# Patient Record
Sex: Female | Born: 1950 | Race: White | Hispanic: No | Marital: Married | Smoking: Never smoker
Health system: Southern US, Community
[De-identification: ages and names within clinical notes are randomized; demographics above are authoritative.]

## PROBLEM LIST (undated history)

## (undated) DIAGNOSIS — F32A Depression, unspecified: Secondary | ICD-10-CM

## (undated) DIAGNOSIS — I77819 Aortic ectasia, unspecified site: Secondary | ICD-10-CM

## (undated) DIAGNOSIS — Z170 Estrogen receptor positive status [ER+]: Secondary | ICD-10-CM

## (undated) DIAGNOSIS — R195 Other fecal abnormalities: Secondary | ICD-10-CM

## (undated) DIAGNOSIS — D0510 Intraductal carcinoma in situ of unspecified breast: Secondary | ICD-10-CM

## (undated) DIAGNOSIS — D649 Anemia, unspecified: Secondary | ICD-10-CM

## (undated) DIAGNOSIS — D0511 Intraductal carcinoma in situ of right breast: Secondary | ICD-10-CM

## (undated) DIAGNOSIS — Z90722 Acquired absence of ovaries, bilateral: Secondary | ICD-10-CM

## (undated) DIAGNOSIS — Z9079 Acquired absence of other genital organ(s): Secondary | ICD-10-CM

## (undated) DIAGNOSIS — F329 Major depressive disorder, single episode, unspecified: Secondary | ICD-10-CM

## (undated) DIAGNOSIS — R079 Chest pain, unspecified: Secondary | ICD-10-CM

## (undated) DIAGNOSIS — J45909 Unspecified asthma, uncomplicated: Secondary | ICD-10-CM

## (undated) DIAGNOSIS — I10 Essential (primary) hypertension: Secondary | ICD-10-CM

## (undated) DIAGNOSIS — F410 Panic disorder [episodic paroxysmal anxiety] without agoraphobia: Secondary | ICD-10-CM

## (undated) DIAGNOSIS — C50919 Malignant neoplasm of unspecified site of unspecified female breast: Secondary | ICD-10-CM

## (undated) DIAGNOSIS — K449 Diaphragmatic hernia without obstruction or gangrene: Secondary | ICD-10-CM

## (undated) DIAGNOSIS — I5189 Other ill-defined heart diseases: Secondary | ICD-10-CM

## (undated) DIAGNOSIS — F4321 Adjustment disorder with depressed mood: Secondary | ICD-10-CM

## (undated) DIAGNOSIS — Z90710 Acquired absence of both cervix and uterus: Secondary | ICD-10-CM

## (undated) DIAGNOSIS — C50511 Malignant neoplasm of lower-outer quadrant of right female breast: Secondary | ICD-10-CM

## (undated) DIAGNOSIS — E785 Hyperlipidemia, unspecified: Secondary | ICD-10-CM

## (undated) DIAGNOSIS — E669 Obesity, unspecified: Secondary | ICD-10-CM

## (undated) DIAGNOSIS — Z79899 Other long term (current) drug therapy: Secondary | ICD-10-CM

## (undated) DIAGNOSIS — D259 Leiomyoma of uterus, unspecified: Secondary | ICD-10-CM

## (undated) DIAGNOSIS — Z973 Presence of spectacles and contact lenses: Secondary | ICD-10-CM

## (undated) HISTORY — DX: Acquired absence of other genital organ(s): Z90.79

## (undated) HISTORY — DX: Other fecal abnormalities: R19.5

## (undated) HISTORY — DX: Malignant neoplasm of lower-outer quadrant of right female breast: C50.511

## (undated) HISTORY — DX: Acquired absence of ovaries, bilateral: Z90.722

## (undated) HISTORY — DX: Obesity, unspecified: E66.9

## (undated) HISTORY — PX: TONSILLECTOMY: SUR1361

## (undated) HISTORY — DX: Estrogen receptor positive status (ER+): Z17.0

## (undated) HISTORY — PX: BREAST BIOPSY: SHX20

## (undated) HISTORY — PX: WISDOM TOOTH EXTRACTION: SHX21

## (undated) HISTORY — DX: Intraductal carcinoma in situ of unspecified breast: D05.10

## (undated) HISTORY — DX: Acquired absence of both cervix and uterus: Z90.710

## (undated) HISTORY — DX: Chest pain, unspecified: R07.9

## (undated) HISTORY — DX: Intraductal carcinoma in situ of right breast: D05.11

## (undated) HISTORY — PX: BREAST LUMPECTOMY: SHX2

## (undated) HISTORY — DX: Other ill-defined heart diseases: I51.89

## (undated) HISTORY — PX: HERNIA REPAIR: SHX51

## (undated) HISTORY — DX: Anemia, unspecified: D64.9

## (undated) HISTORY — DX: Aortic ectasia, unspecified site: I77.819

## (undated) HISTORY — DX: Adjustment disorder with depressed mood: F43.21

## (undated) HISTORY — DX: Other long term (current) drug therapy: Z79.899

## (undated) HISTORY — DX: Unspecified asthma, uncomplicated: J45.909

## (undated) HISTORY — PX: DILATION AND CURETTAGE OF UTERUS: SHX78

---

## 1996-05-28 HISTORY — PX: ABDOMINAL HYSTERECTOMY: SHX81

## 1997-11-03 ENCOUNTER — Other Ambulatory Visit: Admission: RE | Admit: 1997-11-03 | Discharge: 1997-11-03 | Payer: Self-pay | Admitting: Obstetrics & Gynecology

## 1997-12-09 ENCOUNTER — Ambulatory Visit (HOSPITAL_COMMUNITY): Admission: RE | Admit: 1997-12-09 | Discharge: 1997-12-09 | Payer: Self-pay | Admitting: Obstetrics & Gynecology

## 1998-12-15 ENCOUNTER — Ambulatory Visit (HOSPITAL_COMMUNITY): Admission: RE | Admit: 1998-12-15 | Discharge: 1998-12-15 | Payer: Self-pay | Admitting: Obstetrics & Gynecology

## 1998-12-15 ENCOUNTER — Encounter: Payer: Self-pay | Admitting: Obstetrics & Gynecology

## 1999-02-01 ENCOUNTER — Other Ambulatory Visit: Admission: RE | Admit: 1999-02-01 | Discharge: 1999-02-01 | Payer: Self-pay | Admitting: Obstetrics & Gynecology

## 2000-03-22 ENCOUNTER — Other Ambulatory Visit: Admission: RE | Admit: 2000-03-22 | Discharge: 2000-03-22 | Payer: Self-pay | Admitting: Obstetrics & Gynecology

## 2001-08-27 ENCOUNTER — Other Ambulatory Visit: Admission: RE | Admit: 2001-08-27 | Discharge: 2001-08-27 | Payer: Self-pay | Admitting: Obstetrics & Gynecology

## 2002-12-08 ENCOUNTER — Other Ambulatory Visit: Admission: RE | Admit: 2002-12-08 | Discharge: 2002-12-08 | Payer: Self-pay | Admitting: Obstetrics & Gynecology

## 2004-07-14 ENCOUNTER — Other Ambulatory Visit: Admission: RE | Admit: 2004-07-14 | Discharge: 2004-07-14 | Payer: Self-pay | Admitting: Obstetrics & Gynecology

## 2007-02-21 ENCOUNTER — Encounter: Admission: RE | Admit: 2007-02-21 | Discharge: 2007-02-21 | Payer: Self-pay | Admitting: Obstetrics & Gynecology

## 2010-06-19 ENCOUNTER — Encounter: Payer: Self-pay | Admitting: Obstetrics & Gynecology

## 2013-09-18 ENCOUNTER — Other Ambulatory Visit: Payer: Self-pay | Admitting: Obstetrics & Gynecology

## 2013-09-18 DIAGNOSIS — R928 Other abnormal and inconclusive findings on diagnostic imaging of breast: Secondary | ICD-10-CM

## 2013-10-01 ENCOUNTER — Ambulatory Visit
Admission: RE | Admit: 2013-10-01 | Discharge: 2013-10-01 | Disposition: A | Discharge status: Home / Self Care | Payer: BC Managed Care – PPO | Source: Ambulatory Visit | Attending: Obstetrics & Gynecology | Admitting: Obstetrics & Gynecology

## 2013-10-01 ENCOUNTER — Other Ambulatory Visit: Payer: Self-pay | Admitting: Obstetrics & Gynecology

## 2013-10-01 DIAGNOSIS — R928 Other abnormal and inconclusive findings on diagnostic imaging of breast: Secondary | ICD-10-CM

## 2013-10-06 ENCOUNTER — Other Ambulatory Visit: Payer: Self-pay | Admitting: Obstetrics & Gynecology

## 2013-10-06 ENCOUNTER — Ambulatory Visit
Admission: RE | Admit: 2013-10-06 | Discharge: 2013-10-06 | Disposition: A | Discharge status: Home / Self Care | Payer: BC Managed Care – PPO | Source: Ambulatory Visit | Attending: Obstetrics & Gynecology | Admitting: Obstetrics & Gynecology

## 2013-10-06 DIAGNOSIS — R921 Mammographic calcification found on diagnostic imaging of breast: Secondary | ICD-10-CM

## 2013-10-06 DIAGNOSIS — R928 Other abnormal and inconclusive findings on diagnostic imaging of breast: Secondary | ICD-10-CM

## 2013-10-07 ENCOUNTER — Other Ambulatory Visit: Payer: Self-pay | Admitting: Obstetrics & Gynecology

## 2013-10-07 DIAGNOSIS — C50919 Malignant neoplasm of unspecified site of unspecified female breast: Secondary | ICD-10-CM

## 2013-10-09 ENCOUNTER — Telehealth: Payer: Self-pay | Admitting: *Deleted

## 2013-10-09 ENCOUNTER — Encounter: Payer: Self-pay | Admitting: *Deleted

## 2013-10-09 DIAGNOSIS — C50511 Malignant neoplasm of lower-outer quadrant of right female breast: Secondary | ICD-10-CM

## 2013-10-09 HISTORY — DX: Malignant neoplasm of lower-outer quadrant of right female breast: C50.511

## 2013-10-09 NOTE — Telephone Encounter (Signed)
Confirmed [facility] for 10/14/13 at 0830 .  Instructions and contact information given.

## 2013-10-13 ENCOUNTER — Ambulatory Visit
Admission: RE | Admit: 2013-10-13 | Discharge: 2013-10-13 | Disposition: A | Discharge status: Home / Self Care | Payer: BC Managed Care – PPO | Source: Ambulatory Visit | Attending: Obstetrics & Gynecology | Admitting: Obstetrics & Gynecology

## 2013-10-13 DIAGNOSIS — C50919 Malignant neoplasm of unspecified site of unspecified female breast: Secondary | ICD-10-CM

## 2013-10-13 MED ORDER — GADOBENATE DIMEGLUMINE 529 MG/ML IV SOLN
17.0000 mL | Freq: Once | INTRAVENOUS | Status: AC | PRN
  Administered 2013-10-13: 17 mL via INTRAVENOUS

## 2013-10-14 ENCOUNTER — Ambulatory Visit: Payer: BC Managed Care – PPO | Admitting: Physical Therapy

## 2013-10-14 ENCOUNTER — Encounter: Payer: Self-pay | Admitting: Radiation Oncology

## 2013-10-14 ENCOUNTER — Ambulatory Visit
Admission: RE | Admit: 2013-10-14 | Discharge: 2013-10-14 | Disposition: A | Discharge status: Home / Self Care | Payer: BC Managed Care – PPO | Source: Ambulatory Visit | Attending: Radiation Oncology | Admitting: Radiation Oncology

## 2013-10-14 ENCOUNTER — Ambulatory Visit (HOSPITAL_BASED_OUTPATIENT_CLINIC_OR_DEPARTMENT_OTHER): Payer: BC Managed Care – PPO | Admitting: General Surgery

## 2013-10-14 ENCOUNTER — Encounter (INDEPENDENT_AMBULATORY_CARE_PROVIDER_SITE_OTHER): Payer: Self-pay | Admitting: General Surgery

## 2013-10-14 VITALS — BP 149/91 | HR 91 | Temp 97.8°F | Wt 186.5 lb

## 2013-10-14 DIAGNOSIS — C50511 Malignant neoplasm of lower-outer quadrant of right female breast: Secondary | ICD-10-CM

## 2013-10-14 DIAGNOSIS — C50519 Malignant neoplasm of lower-outer quadrant of unspecified female breast: Secondary | ICD-10-CM

## 2013-10-14 HISTORY — DX: Leiomyoma of uterus, unspecified: D25.9

## 2013-10-14 HISTORY — DX: Hyperlipidemia, unspecified: E78.5

## 2013-10-14 HISTORY — DX: Panic disorder (episodic paroxysmal anxiety): F41.0

## 2013-10-14 HISTORY — DX: Essential (primary) hypertension: I10

## 2013-10-14 HISTORY — DX: Major depressive disorder, single episode, unspecified: F32.9

## 2013-10-14 HISTORY — DX: Depression, unspecified: F32.A

## 2013-10-14 NOTE — Progress Notes (Signed)
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 female  who underwent a screening mammogram. She was found have an abnormal area of calcifications. 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 MRI was negative for other sites of disease. No abnormal appearing lymph nodes were noted. No abnormalities the left breast was noted as well. She has been on hormone replacement since her hysterectomy almost 20 years ago. She is GX P2 with her first live birth at 52. Is accompanied by her husband today. No previous radiation or previous history of cancer. She is accompanied by her husband today. She has a little bit of soreness after biopsy.   PREVIOUS RADIATION THERAPY: No  PAST MEDICAL HISTORY:  has a past medical history of Hypertension; Depression; Hyperlipidemia; Panic attack; and Uterine fibroid.    PAST SURGICAL HISTORY: Past Surgical History  Procedure Laterality Date  . Abdominal hysterectomy    . Tonsillectomy    . Hernia repair      FAMILY HISTORY: No family history on file.  SOCIAL HISTORY:  History  Substance Use Topics  . Smoking status: Never Smoker   . Smokeless tobacco: Not on file  . Alcohol Use: 1.0 - 1.5 oz/week    2-3 drink(s) per week    ALLERGIES: Review of patient's allergies indicates no known allergies.  MEDICATIONS:  Current Outpatient Prescriptions  Medication Sig Dispense Refill  . aspirin 81 MG tablet Take 81 mg by mouth daily.      Marland Kitchen buPROPion (WELLBUTRIN XL) 300 MG 24 hr tablet       . busPIRone (BUSPAR) 15 MG tablet       . calcium carbonate (OS-CAL) 600 MG TABS tablet Take 600 mg by mouth 2 (two) times daily with a  meal.      . chlorpheniramine (CHLOR-TRIMETON) 2 MG/5ML syrup Take 2 mg by mouth daily.      . CRESTOR 10 MG tablet       . fexofenadine (ALLEGRA) 30 MG tablet Take 30 mg by mouth daily.      Marland Kitchen lisinopril (PRINIVIL,ZESTRIL) 5 MG tablet       . Omega-3 Fatty Acids (OMEGA-3 FISH OIL PO) Take by mouth daily.      Marland Kitchen PREMARIN 0.9 MG tablet        No current facility-administered medications for this encounter.    REVIEW OF SYSTEMS:  A 15 point review of systems is documented in the electronic medical record. This was obtained by the nursing staff. However, I reviewed this with the patient to discuss relevant findings and make appropriate changes.  Pertinent items are noted in HPI.  PHYSICAL EXAM:  Filed Vitals:   10/14/13 0900  BP: 149/91  Pulse: 91  Temp: 97.8 F (36.6 C)  .186 lb 8 oz (84.596 kg). Pleasant female in no distress sitting comfortably on examining table. She has large pendulous breasts bilaterally. She has some bruising in the medial aspect of the right breast. No abnormalities of the left breast. No palpable adenopathy. No palpable cervical and supraclavicular adenopathy. She is alert and oriented x3. She appears her stated age.  LABORATORY DATA:  No results found for this basename: WBC,  HGB, HCT, MCV, PLT   No results found for this basename: NA, K, CL, CO2   No results found for this basename: ALT, AST, GGT, ALKPHOS, BILITOT     RADIOGRAPHY: Mr Breast Bilateral W Wo Contrast  10/13/2013   CLINICAL DATA:  Recently diagnosed right breast DCIS on stereotactic core biopsy. Preoperative evaluation.  LABS:  BUN and creatinine were obtained on site at [facility] at  [address]  Results:  BUN 9 mg/dL,  Creatinine 0.9 mg/dL.  EXAM: BILATERAL BREAST MRI WITH AND WITHOUT CONTRAST  TECHNIQUE: Multiplanar, multisequence MR images of both breasts were obtained prior to and following the intravenous administration of 57ml of MultiHance  THREE-DIMENSIONAL MR IMAGE  RENDERING ON INDEPENDENT WORKSTATION:  Three-dimensional MR images were rendered by post-processing of the original MR data on an independent workstation. The three-dimensional MR images were interpreted, and findings are reported in the following complete MRI report for this study. Three dimensional images were evaluated at the independent DynaCad workstation  COMPARISON:  Previous examinations.  FINDINGS: Breast composition: Density B:  Scattered fibroglandular.  Background parenchymal enhancement: Minimal  Right breast: Post biopsy changes present within the lower outer quadrant of the middle 1/3 of the right breast related to patient's recent stereotactic biopsy with central clip artifact. There is no worrisome enhancement within the right breast.  Left breast: No mass or abnormal enhancement.  Lymph nodes: No abnormal appearing lymph nodes.  Ancillary findings: There is a moderate sized hiatal hernia incidentally noted.  IMPRESSION: Post biopsy change associated with the lower outer quadrant of the right breast. No worrisome areas of enhancement within either breast and no evidence for adenopathy. Incidental hiatal hernia noted.  RECOMMENDATION: Treatment plan  BI-RADS CATEGORY  6: Known biopsy-proven malignancy.   Electronically Signed   By: Luberta Robertson M.D.   On: 10/13/2013 11:52   Mm Digital Diagnostic Unilat R  10/01/2013   CLINICAL DATA:  Screening callback for questioned right breast calcifications  EXAM: DIGITAL DIAGNOSTIC  right MAMMOGRAM  COMPARISON:  Prior exams  ACR Breast Density Category b: There are scattered areas of fibroglandular density.  FINDINGS: Additional views confirm the presence of a 7 mm cluster of amorphous calcifications in the right lower outer quadrant, corresponding to the screening mammographic finding.  IMPRESSION: Suspicious right breast calcifications, lower outer quadrant. Stereotactic core needle biopsy is recommended. This will be scheduled at the patient's  convenience.  RECOMMENDATION: Right stereotactic core needle biopsy  I have discussed the findings and recommendations with the patient. Results were also provided in writing at the conclusion of the visit. If applicable, a reminder letter will be sent to the patient regarding the next appointment.  BI-RADS CATEGORY  4: Suspicious.   Electronically Signed   By: Conchita Paris M.D.   On: 10/01/2013 16:53   Mm Rt Breast Bx W Loc Dev 1st Lesion Image Bx Spec Stereo Guide  10/07/2013   ADDENDUM REPORT: 10/07/2013 13:12  ADDENDUM: The pathology associated with the right breast stereotactic core biopsy demonstrated DCIS. This is concordant with the imaging findings. I have discussed findings with the patient by telephone and answered her questions. The patient states her biopsy site is clean and dry without hematoma formation or signs of infection. Post biopsy wound care instructions were reviewed with the patient. The patient is scheduled for breast MRI on 10/13/2013. Patient is scheduled for the breast cancer multi disciplinary clinic on 10/14/2013. The patient was encouraged to call the breast center for additional  questions or concerns.   Electronically Signed   By: Luberta Robertson M.D.   On: 10/07/2013 13:12   10/07/2013   CLINICAL DATA:  Right breast calcifications.  EXAM: Right BREAST STEREOTACTIC CORE NEEDLE BIOPSY  COMPARISON:  Previous exams.  FINDINGS: The patient and I discussed the procedure of stereotactic-guided biopsy including benefits and alternatives. We discussed the high likelihood of a successful procedure. We discussed the risks of the procedure including infection, bleeding, tissue injury, clip migration, and inadequate sampling. Informed written consent was given. The usual time out protocol was performed immediately prior to the procedure.  Using sterile technique and 2% lidocaine as local anesthetic, under stereotactic guidance, a 9 gauge vacuum assisted core biopsy device was used to  perform core needle biopsy of calcifications within the lower outer quadrant of the right breast using an inferior craniocaudal approach approach. Specimen radiograph was performed showing representative calcifications within the specimen. Specimens with calcifications are identified for pathology.  At the conclusion of the procedure, a cylinder-shaped tissue marker clip was deployed into the biopsy cavity. Follow-up 2-view mammogram confirmed clip to be in appropriate position.  IMPRESSION: Stereotactic-guided biopsy of right breast calcifications as discussed above. No apparent complications.  Electronically Signed: By: Luberta Robertson M.D. On: 10/06/2013 15:21      IMPRESSION: Stage 0 Right Breast Cancer  PLAN: We discussed the natural history of ductal carcinoma in situ. We discussed the noninvasive nature of this disease. We discussed the role of lumpectomy and postoperative radiation. We discussed the role of radiation and decreasing local failures in the breast. We discussed the process of simulation the placement tattoos. We discussed possible skin redness and fatigue as side effects. We discussed 6 weeks of addition given the size of her breast. I believe the hypofractionated radiation would result in further skin toxicity. We discussed starting her treatment about a month after surgery. Her husband was a prostate patient Dr. Charlton Amor and is familiar with the process of radiation. We discussed the need for antiestrogen therapy after her radiation for chemoprevention. She will be referred to medical oncology.  I spent 60 minutes  face to face with the patient and more than 50% of that time was spent in counseling and/or coordination of care.   ------------------------------------------------  Thea Silversmith, MD

## 2013-10-14 NOTE — Progress Notes (Addendum)
Patient ID: Rachel Edwards, female   DOB: 11/21/1950, 62 y.o.   MRN: [id number]  No chief complaint on file.   HPI Rachel Edwards is a 62 y.o. female.  We're asked to see the patient in consultation by Dr. Jackson to evaluate her for right-sided DCIS. The patient is a 62-year-old white female who recently went for a routine screening mammogram. At that time she was not having any breast pain or discharge from her nipple. She was found to have a 7 mm cluster of calcifications centrally in the right breast. This was biopsied and came back as high-grade DCIS. She was ER PR positive. She has no personal or family history of breast cancer. She does take Premarin every day. She does take female hormones.  HPI  Past Medical History  Diagnosis Date  . Hypertension   . Depression   . Hyperlipidemia   . Panic attack   . Uterine fibroid     Past Surgical History  Procedure Laterality Date  . Abdominal hysterectomy    . Tonsillectomy    . Hernia repair      History reviewed. No pertinent family history.  Social History History  Substance Use Topics  . Smoking status: Never Smoker   . Smokeless tobacco: Not on file  . Alcohol Use: 1.0 - 1.5 oz/week    2-3 drink(s) per week    No Known Allergies  Current Outpatient Prescriptions  Medication Sig Dispense Refill  . aspirin 81 MG tablet Take 81 mg by mouth daily.      . buPROPion (WELLBUTRIN XL) 300 MG 24 hr tablet       . busPIRone (BUSPAR) 15 MG tablet       . calcium carbonate (OS-CAL) 600 MG TABS tablet Take 600 mg by mouth 2 (two) times daily with a meal.      . chlorpheniramine (CHLOR-TRIMETON) 2 MG/5ML syrup Take 2 mg by mouth daily.      . CRESTOR 10 MG tablet       . fexofenadine (ALLEGRA) 30 MG tablet Take 30 mg by mouth daily.      . lisinopril (PRINIVIL,ZESTRIL) 5 MG tablet       . Omega-3 Fatty Acids (OMEGA-3 FISH OIL PO) Take by mouth daily.      . PREMARIN 0.9 MG tablet        No current facility-administered  medications for this visit.    Review of Systems Review of Systems  Constitutional: Negative.   HENT: Negative.   Eyes: Negative.   Respiratory: Negative.   Cardiovascular: Negative.   Gastrointestinal: Negative.   Endocrine: Negative.   Genitourinary: Negative.   Musculoskeletal: Negative.   Skin: Negative.   Allergic/Immunologic: Negative.   Neurological: Negative.   Hematological: Negative.   Psychiatric/Behavioral: Negative.     There were no vitals taken for this visit.  Physical Exam Physical Exam  Constitutional: She is oriented to person, place, and time. She appears well-developed and well-nourished.  HENT:  Head: Normocephalic and atraumatic.  Eyes: Conjunctivae and EOM are normal. Pupils are equal, round, and reactive to light.  Neck: Normal range of motion. Neck supple.  Cardiovascular: Normal rate, regular rhythm and normal heart sounds.   Pulmonary/Chest: Effort normal and breath sounds normal.  There is no palpable mass in either breast. There is no palpable axillary, supraclavicular, or cervical lymphadenopathy  Abdominal: Soft. Bowel sounds are normal.  Musculoskeletal: Normal range of motion.  Lymphadenopathy:    She has no cervical adenopathy.    Neurological: She is alert and oriented to person, place, and time.  Skin: Skin is warm and dry.  Psychiatric: She has a normal mood and affect. Her behavior is normal.    Data Reviewed As above  Assessment    The patient appears to have a very small area of DCIS in the central lateral right breast. I've talked her in detail the different options for treatment. She favors breast conservation I think this is a reasonable option for her. Given the size and location I do not think she necessarily needs to have a lymph node evaluation. She will need a radioactive seed localized right breast lumpectomy. I've discussed with her in detail the risks and benefits of the operation to do this as well as some of the  technical aspects and she understands and wishes to proceed     Plan    Plan for right breast Radioactive seed localized lumpectomy. She has been advised to stop taking Premarin       Cherine Drumgoole S Toth III 10/14/2013, 10:53 AM    

## 2013-10-20 ENCOUNTER — Telehealth: Payer: Self-pay | Admitting: *Deleted

## 2013-10-20 ENCOUNTER — Encounter: Payer: Self-pay | Admitting: *Deleted

## 2013-10-20 NOTE — Progress Notes (Signed)
[facility] [place] Psychosocial Distress Screening Clinical Social Work  Patient completed distress screening protocol, and scored a 6 on the Psychosocial Distress Thermometer which indicates moderate distress. Clinical Social Worker met with pt in [facility] to assess for distress and other psychosocial needs.  Pt stated she was doing "ok" and was thankful to have more information on her treatment plan.  CSw informed pt of the support team and support services at [facility].  Pt was appreciative of information and requested to be placed on the [facility] list.  CSW provided contact information and encouraged pt to call with any questions or concerns.     Johnnye Lana, MSW, [place] Worker [facility] [phone]

## 2013-10-20 NOTE — Telephone Encounter (Signed)
Called and spoke with patient from [facility] 10/14/13.  No questions or concerns at this time.  Patient states she anxiously awaiting a surgery date.  Encouraged patient to call with any needs.

## 2013-10-21 ENCOUNTER — Other Ambulatory Visit (INDEPENDENT_AMBULATORY_CARE_PROVIDER_SITE_OTHER): Payer: Self-pay | Admitting: General Surgery

## 2013-10-21 DIAGNOSIS — C50511 Malignant neoplasm of lower-outer quadrant of right female breast: Secondary | ICD-10-CM

## 2013-10-29 ENCOUNTER — Encounter (HOSPITAL_BASED_OUTPATIENT_CLINIC_OR_DEPARTMENT_OTHER): Payer: Self-pay | Admitting: *Deleted

## 2013-10-29 NOTE — Progress Notes (Signed)
To come in for ekg bmet when comes for seeds 6/9-

## 2013-11-03 ENCOUNTER — Encounter (HOSPITAL_BASED_OUTPATIENT_CLINIC_OR_DEPARTMENT_OTHER)
Admission: RE | Admit: 2013-11-03 | Discharge: 2013-11-03 | Disposition: A | Discharge status: Home / Self Care | Payer: BC Managed Care – PPO | Source: Ambulatory Visit | Attending: General Surgery | Admitting: General Surgery

## 2013-11-03 ENCOUNTER — Other Ambulatory Visit: Payer: Self-pay

## 2013-11-03 ENCOUNTER — Ambulatory Visit
Admission: RE | Admit: 2013-11-03 | Discharge: 2013-11-03 | Disposition: A | Discharge status: Home / Self Care | Payer: BC Managed Care – PPO | Source: Ambulatory Visit | Attending: General Surgery | Admitting: General Surgery

## 2013-11-03 DIAGNOSIS — C50511 Malignant neoplasm of lower-outer quadrant of right female breast: Secondary | ICD-10-CM

## 2013-11-03 LAB — BASIC METABOLIC PANEL
BUN: 10 mg/dL (ref 6–23)
CO2: 27 mEq/L (ref 19–32)
Calcium: 9.8 mg/dL (ref 8.4–10.5)
Chloride: 102 mEq/L (ref 96–112)
Creatinine, Ser: 0.81 mg/dL (ref 0.50–1.10)
GFR, EST AFRICAN AMERICAN: 88 mL/min — AB (ref >90)
GFR, EST NON AFRICAN AMERICAN: 76 mL/min — AB (ref >90)
Glucose, Bld: 87 mg/dL (ref 70–99)
POTASSIUM: 5 meq/L (ref 3.7–5.3)
SODIUM: 141 meq/L (ref 137–147)

## 2013-11-04 ENCOUNTER — Ambulatory Visit (HOSPITAL_BASED_OUTPATIENT_CLINIC_OR_DEPARTMENT_OTHER): Payer: BC Managed Care – PPO | Admitting: Anesthesiology

## 2013-11-04 ENCOUNTER — Encounter (HOSPITAL_BASED_OUTPATIENT_CLINIC_OR_DEPARTMENT_OTHER): Payer: Self-pay | Admitting: Anesthesiology

## 2013-11-04 ENCOUNTER — Encounter (HOSPITAL_BASED_OUTPATIENT_CLINIC_OR_DEPARTMENT_OTHER): Payer: BC Managed Care – PPO | Admitting: Anesthesiology

## 2013-11-04 ENCOUNTER — Ambulatory Visit (HOSPITAL_BASED_OUTPATIENT_CLINIC_OR_DEPARTMENT_OTHER)
Admission: RE | Admit: 2013-11-04 | Discharge: 2013-11-04 | Disposition: A | Discharge status: Home / Self Care | Payer: BC Managed Care – PPO | Source: Ambulatory Visit | Attending: General Surgery | Admitting: General Surgery

## 2013-11-04 ENCOUNTER — Ambulatory Visit
Admission: RE | Admit: 2013-11-04 | Discharge: 2013-11-04 | Disposition: A | Discharge status: Home / Self Care | Payer: BC Managed Care – PPO | Source: Ambulatory Visit | Attending: General Surgery | Admitting: General Surgery

## 2013-11-04 ENCOUNTER — Encounter (HOSPITAL_BASED_OUTPATIENT_CLINIC_OR_DEPARTMENT_OTHER)
Admission: RE | Disposition: A | Discharge status: Home / Self Care | Payer: Self-pay | Source: Ambulatory Visit | Attending: General Surgery

## 2013-11-04 DIAGNOSIS — C50511 Malignant neoplasm of lower-outer quadrant of right female breast: Secondary | ICD-10-CM

## 2013-11-04 DIAGNOSIS — F329 Major depressive disorder, single episode, unspecified: Secondary | ICD-10-CM | POA: Insufficient documentation

## 2013-11-04 DIAGNOSIS — F411 Generalized anxiety disorder: Secondary | ICD-10-CM | POA: Insufficient documentation

## 2013-11-04 DIAGNOSIS — D0590 Unspecified type of carcinoma in situ of unspecified breast: Secondary | ICD-10-CM | POA: Insufficient documentation

## 2013-11-04 DIAGNOSIS — Z7982 Long term (current) use of aspirin: Secondary | ICD-10-CM | POA: Insufficient documentation

## 2013-11-04 DIAGNOSIS — F3289 Other specified depressive episodes: Secondary | ICD-10-CM | POA: Insufficient documentation

## 2013-11-04 DIAGNOSIS — I10 Essential (primary) hypertension: Secondary | ICD-10-CM | POA: Insufficient documentation

## 2013-11-04 DIAGNOSIS — Z79899 Other long term (current) drug therapy: Secondary | ICD-10-CM | POA: Insufficient documentation

## 2013-11-04 DIAGNOSIS — E785 Hyperlipidemia, unspecified: Secondary | ICD-10-CM | POA: Insufficient documentation

## 2013-11-04 HISTORY — DX: Presence of spectacles and contact lenses: Z97.3

## 2013-11-04 HISTORY — PX: BREAST LUMPECTOMY WITH RADIOACTIVE SEED LOCALIZATION: SHX6424

## 2013-11-04 LAB — POCT HEMOGLOBIN-HEMACUE: Hemoglobin: 13.2 g/dL (ref 12.0–15.0)

## 2013-11-04 SURGERY — BREAST LUMPECTOMY WITH RADIOACTIVE SEED LOCALIZATION
Anesthesia: General | Site: Breast | Laterality: Right

## 2013-11-04 MED ORDER — CEFAZOLIN SODIUM-DEXTROSE 2-3 GM-% IV SOLR
2.0000 g | INTRAVENOUS | Status: AC
  Administered 2013-11-04: 2 g via INTRAVENOUS

## 2013-11-04 MED ORDER — OXYCODONE HCL 5 MG/5ML PO SOLN: 5.0000 mg | Freq: Once | ORAL | Status: DC | PRN

## 2013-11-04 MED ORDER — LACTATED RINGERS IV SOLN
INTRAVENOUS | Status: DC
  Administered 2013-11-04 (×2): via INTRAVENOUS

## 2013-11-04 MED ORDER — PROPOFOL 10 MG/ML IV BOLUS
INTRAVENOUS | Status: DC | PRN
  Administered 2013-11-04: 200 mg via INTRAVENOUS

## 2013-11-04 MED ORDER — MIDAZOLAM HCL 2 MG/2ML IJ SOLN
INTRAMUSCULAR | Status: AC
  Filled 2013-11-04: qty 2

## 2013-11-04 MED ORDER — HYDROMORPHONE HCL PF 1 MG/ML IJ SOLN
INTRAMUSCULAR | Status: AC
  Filled 2013-11-04: qty 1

## 2013-11-04 MED ORDER — CEFAZOLIN SODIUM-DEXTROSE 2-3 GM-% IV SOLR
INTRAVENOUS | Status: AC
  Filled 2013-11-04: qty 50

## 2013-11-04 MED ORDER — DEXAMETHASONE SODIUM PHOSPHATE 4 MG/ML IJ SOLN
INTRAMUSCULAR | Status: DC | PRN
  Administered 2013-11-04: 10 mg via INTRAVENOUS

## 2013-11-04 MED ORDER — FENTANYL CITRATE 0.05 MG/ML IJ SOLN
50.0000 ug | INTRAMUSCULAR | Status: DC | PRN
Start: 2013-11-04 — End: 2013-11-04

## 2013-11-04 MED ORDER — FENTANYL CITRATE 0.05 MG/ML IJ SOLN
INTRAMUSCULAR | Status: DC | PRN
  Administered 2013-11-04: 100 ug via INTRAVENOUS
  Administered 2013-11-04 (×2): 50 ug via INTRAVENOUS

## 2013-11-04 MED ORDER — MIDAZOLAM HCL 2 MG/2ML IJ SOLN: 1.0000 mg | INTRAMUSCULAR | Status: DC | PRN

## 2013-11-04 MED ORDER — CHLORHEXIDINE GLUCONATE 4 % EX LIQD: 1.0000 "application " | Freq: Once | CUTANEOUS | Status: DC

## 2013-11-04 MED ORDER — MIDAZOLAM HCL 5 MG/5ML IJ SOLN
INTRAMUSCULAR | Status: DC | PRN
Start: 2013-11-04 — End: 2013-11-04
  Administered 2013-11-04: 2 mg via INTRAVENOUS

## 2013-11-04 MED ORDER — OXYCODONE-ACETAMINOPHEN 5-325 MG PO TABS: 1.0000 | ORAL_TABLET | ORAL | Status: DC | PRN

## 2013-11-04 MED ORDER — OXYCODONE HCL 5 MG PO TABS: 5.0000 mg | ORAL_TABLET | Freq: Once | ORAL | Status: DC | PRN

## 2013-11-04 MED ORDER — BUPIVACAINE-EPINEPHRINE (PF) 0.25% -1:200000 IJ SOLN
INTRAMUSCULAR | Status: AC
  Filled 2013-11-04: qty 30

## 2013-11-04 MED ORDER — LIDOCAINE HCL (CARDIAC) 20 MG/ML IV SOLN
INTRAVENOUS | Status: DC | PRN
  Administered 2013-11-04: 50 mg via INTRAVENOUS

## 2013-11-04 MED ORDER — BUPIVACAINE HCL (PF) 0.25 % IJ SOLN
INTRAMUSCULAR | Status: AC
  Filled 2013-11-04: qty 30

## 2013-11-04 MED ORDER — HYDROMORPHONE HCL PF 1 MG/ML IJ SOLN
0.2500 mg | INTRAMUSCULAR | Status: DC | PRN
  Administered 2013-11-04: 0.5 mg via INTRAVENOUS

## 2013-11-04 MED ORDER — FENTANYL CITRATE 0.05 MG/ML IJ SOLN
INTRAMUSCULAR | Status: AC
  Filled 2013-11-04: qty 8

## 2013-11-04 MED ORDER — BUPIVACAINE-EPINEPHRINE (PF) 0.25% -1:200000 IJ SOLN
INTRAMUSCULAR | Status: DC | PRN
  Administered 2013-11-04: 20 mL

## 2013-11-04 MED ORDER — LIDOCAINE HCL (PF) 1 % IJ SOLN
INTRAMUSCULAR | Status: AC
  Filled 2013-11-04: qty 30

## 2013-11-04 SURGICAL SUPPLY — 48 items
ADH SKN CLS APL DERMABOND .7 (GAUZE/BANDAGES/DRESSINGS) ×1
APPLIER CLIP 9.375 MED OPEN (MISCELLANEOUS) ×2
APR CLP MED 9.3 20 MLT OPN (MISCELLANEOUS) ×1
BINDER BREAST LRG (GAUZE/BANDAGES/DRESSINGS) IMPLANT
BINDER BREAST MEDIUM (GAUZE/BANDAGES/DRESSINGS) IMPLANT
BINDER BREAST XLRG (GAUZE/BANDAGES/DRESSINGS) IMPLANT
BINDER BREAST XXLRG (GAUZE/BANDAGES/DRESSINGS) IMPLANT
BLADE 15 SAFETY STRL DISP (BLADE) ×2 IMPLANT
CANISTER SUC SOCK COL 7IN (MISCELLANEOUS) ×2 IMPLANT
CANISTER SUCT 1200ML W/VALVE (MISCELLANEOUS) ×2 IMPLANT
CHLORAPREP W/TINT 26ML (MISCELLANEOUS) ×2 IMPLANT
CLIP APPLIE 9.375 MED OPEN (MISCELLANEOUS) IMPLANT
CLIP TI WIDE RED SMALL 6 (CLIP) ×1 IMPLANT
COVER MAYO STAND STRL (DRAPES) ×2 IMPLANT
COVER PROBE W GEL 5X96 (DRAPES) ×2 IMPLANT
COVER TABLE BACK 60X90 (DRAPES) ×2 IMPLANT
DECANTER SPIKE VIAL GLASS SM (MISCELLANEOUS) IMPLANT
DERMABOND ADVANCED (GAUZE/BANDAGES/DRESSINGS) ×1
DERMABOND ADVANCED .7 DNX12 (GAUZE/BANDAGES/DRESSINGS) ×1 IMPLANT
DEVICE DUBIN W/COMP PLATE 8390 (MISCELLANEOUS) ×2 IMPLANT
DRAPE LAPAROSCOPIC ABDOMINAL (DRAPES) IMPLANT
DRAPE PED LAPAROTOMY (DRAPES) ×2 IMPLANT
DRAPE UTILITY XL STRL (DRAPES) ×2 IMPLANT
ELECT COATED BLADE 2.86 ST (ELECTRODE) ×2 IMPLANT
ELECT REM PT RETURN 9FT ADLT (ELECTROSURGICAL) ×2
ELECTRODE REM PT RTRN 9FT ADLT (ELECTROSURGICAL) ×1 IMPLANT
GLOVE BIO SURGEON STRL SZ7.5 (GLOVE) ×4 IMPLANT
GLOVE BIOGEL PI IND STRL 7.5 (GLOVE) IMPLANT
GLOVE BIOGEL PI INDICATOR 7.5 (GLOVE) ×3
GLOVE EXAM NITRILE EXT CUFF MD (GLOVE) ×1 IMPLANT
GOWN STRL REUS W/ TWL LRG LVL3 (GOWN DISPOSABLE) ×2 IMPLANT
GOWN STRL REUS W/TWL LRG LVL3 (GOWN DISPOSABLE) ×6
KIT MARKER MARGIN INK (KITS) ×2 IMPLANT
NDL HYPO 25X1 1.5 SAFETY (NEEDLE) IMPLANT
NEEDLE HYPO 25X1 1.5 SAFETY (NEEDLE) ×2 IMPLANT
NS IRRIG 1000ML POUR BTL (IV SOLUTION) ×2 IMPLANT
PACK BASIN DAY SURGERY FS (CUSTOM PROCEDURE TRAY) ×2 IMPLANT
PENCIL BUTTON HOLSTER BLD 10FT (ELECTRODE) ×2 IMPLANT
SLEEVE SCD COMPRESS KNEE MED (MISCELLANEOUS) ×2 IMPLANT
SPONGE LAP 4X18 X RAY DECT (DISPOSABLE) ×2 IMPLANT
SUT MON AB 4-0 PC3 18 (SUTURE) ×2 IMPLANT
SUT SILK 2 0 SH (SUTURE) IMPLANT
SUT VICRYL 3-0 CR8 SH (SUTURE) ×2 IMPLANT
SYR CONTROL 10ML LL (SYRINGE) ×1 IMPLANT
TOWEL OR 17X24 6PK STRL BLUE (TOWEL DISPOSABLE) ×2 IMPLANT
TOWEL OR NON WOVEN STRL DISP B (DISPOSABLE) ×2 IMPLANT
TUBE CONNECTING 20X1/4 (TUBING) ×2 IMPLANT
YANKAUER SUCT BULB TIP NO VENT (SUCTIONS) ×2 IMPLANT

## 2013-11-04 NOTE — Anesthesia Preprocedure Evaluation (Addendum)
Anesthesia Evaluation  Patient identified by MRN, date of birth, ID band Patient awake    Reviewed: Allergy & Precautions, H&P , NPO status , Patient's Chart, lab work & pertinent test results  Airway Mallampati: II TM Distance: >3 FB Neck ROM: Full    Dental no notable dental hx. (+) Teeth Intact, Dental Advisory Given   Pulmonary neg pulmonary ROS,  breath sounds clear to auscultation  Pulmonary exam normal       Cardiovascular hypertension, On Medications Rhythm:Regular Rate:Normal     Neuro/Psych Anxiety Depression negative neurological ROS     GI/Hepatic negative GI ROS, Neg liver ROS,   Endo/Other  negative endocrine ROS  Renal/GU negative Renal ROS  negative genitourinary   Musculoskeletal   Abdominal   Peds  Hematology negative hematology ROS (+)   Anesthesia Other Findings   Reproductive/Obstetrics negative OB ROS                         Anesthesia Physical Anesthesia Plan  ASA: II  Anesthesia Plan: General   Post-op Pain Management:    Induction: Intravenous  Airway Management Planned: LMA  Additional Equipment:   Intra-op Plan:   Post-operative Plan: Extubation in OR  Informed Consent: I have reviewed the patients History and Physical, chart, labs and discussed the procedure including the risks, benefits and alternatives for the proposed anesthesia with the patient or authorized representative who has indicated his/her understanding and acceptance.   Dental advisory given  Plan Discussed with: CRNA  Anesthesia Plan Comments:         Anesthesia Quick Evaluation

## 2013-11-04 NOTE — Interval H&P Note (Signed)
History and Physical Interval Note:  11/04/2013 1:33 PM  Rachel Edwards  has presented today for surgery, with the diagnosis of right breast ductal carcinoma in-situ  The various methods of treatment have been discussed with the patient and family. After consideration of risks, benefits and other options for treatment, the patient has consented to  Procedure(s): RIGHT BREAST LUMPECTOMY WITH RADIOACTIVE SEED LOCALIZATION (Right) as a surgical intervention .  The patient's history has been reviewed, patient examined, no change in status, stable for surgery.  I have reviewed the patient's chart and labs.  Questions were answered to the patient's satisfaction.     TOTH III,Yuleidy Rappleye S

## 2013-11-04 NOTE — H&P (View-Only) (Signed)
Patient ID: Rachel Edwards, female   DOB: [birth year], 63 y.o.   MRN: [id number]  No chief complaint on file.   HPI Rachel Edwards is a 63 y.o. female.  We're asked to see the patient in consultation by Dr. Glennon Mac to evaluate her for right-sided DCIS. The patient is a 63 year old white female who recently went for a routine screening mammogram. At that time she was not having any breast pain or discharge from her nipple. She was found to have a 7 mm cluster of calcifications centrally in the right breast. This was biopsied and came back as high-grade DCIS. She was ER PR positive. She has no personal or family history of breast cancer. She does take Premarin every day. She does take female hormones.  HPI  Past Medical History  Diagnosis Date  . Hypertension   . Depression   . Hyperlipidemia   . Panic attack   . Uterine fibroid     Past Surgical History  Procedure Laterality Date  . Abdominal hysterectomy    . Tonsillectomy    . Hernia repair      History reviewed. No pertinent family history.  Social History History  Substance Use Topics  . Smoking status: Never Smoker   . Smokeless tobacco: Not on file  . Alcohol Use: 1.0 - 1.5 oz/week    2-3 drink(s) per week    No Known Allergies  Current Outpatient Prescriptions  Medication Sig Dispense Refill  . aspirin 81 MG tablet Take 81 mg by mouth daily.      Marland Kitchen buPROPion (WELLBUTRIN XL) 300 MG 24 hr tablet       . busPIRone (BUSPAR) 15 MG tablet       . calcium carbonate (OS-CAL) 600 MG TABS tablet Take 600 mg by mouth 2 (two) times daily with a meal.      . chlorpheniramine (CHLOR-TRIMETON) 2 MG/5ML syrup Take 2 mg by mouth daily.      . CRESTOR 10 MG tablet       . fexofenadine (ALLEGRA) 30 MG tablet Take 30 mg by mouth daily.      Marland Kitchen lisinopril (PRINIVIL,ZESTRIL) 5 MG tablet       . Omega-3 Fatty Acids (OMEGA-3 FISH OIL PO) Take by mouth daily.      Marland Kitchen PREMARIN 0.9 MG tablet        No current facility-administered  medications for this visit.    Review of Systems Review of Systems  Constitutional: Negative.   HENT: Negative.   Eyes: Negative.   Respiratory: Negative.   Cardiovascular: Negative.   Gastrointestinal: Negative.   Endocrine: Negative.   Genitourinary: Negative.   Musculoskeletal: Negative.   Skin: Negative.   Allergic/Immunologic: Negative.   Neurological: Negative.   Hematological: Negative.   Psychiatric/Behavioral: Negative.     There were no vitals taken for this visit.  Physical Exam Physical Exam  Constitutional: She is oriented to person, place, and time. She appears well-developed and well-nourished.  HENT:  Head: Normocephalic and atraumatic.  Eyes: Conjunctivae and EOM are normal. Pupils are equal, round, and reactive to light.  Neck: Normal range of motion. Neck supple.  Cardiovascular: Normal rate, regular rhythm and normal heart sounds.   Pulmonary/Chest: Effort normal and breath sounds normal.  There is no palpable mass in either breast. There is no palpable axillary, supraclavicular, or cervical lymphadenopathy  Abdominal: Soft. Bowel sounds are normal.  Musculoskeletal: Normal range of motion.  Lymphadenopathy:    She has no cervical adenopathy.  Neurological: She is alert and oriented to person, place, and time.  Skin: Skin is warm and dry.  Psychiatric: She has a normal mood and affect. Her behavior is normal.    Data Reviewed As above  Assessment    The patient appears to have a very small area of DCIS in the central lateral right breast. I've talked her in detail the different options for treatment. She favors breast conservation I think this is a reasonable option for her. Given the size and location I do not think she necessarily needs to have a lymph node evaluation. She will need a radioactive seed localized right breast lumpectomy. I've discussed with her in detail the risks and benefits of the operation to do this as well as some of the  technical aspects and she understands and wishes to proceed     Plan    Plan for right breast Radioactive seed localized lumpectomy. She has been advised to stop taking Premarin       Luella Cook III 10/14/2013, 10:53 AM

## 2013-11-04 NOTE — Op Note (Signed)
11/04/2013  3:15 PM  PATIENT:  Rachel Edwards  63 y.o. 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 Donne Hazel   ANESTHESIA:   general  EBL:  Total I/O In: 1000 [I.V.:1000] Out: -   BLOOD ADMINISTERED:none  DRAINS: none   LOCAL MEDICATIONS USED:  MARCAINE     SPECIMEN:  Source of Specimen:  right breast tissue  DISPOSITION OF SPECIMEN:  PATHOLOGY  COUNTS:  YES  TOURNIQUET:  * No tourniquets in log *  DICTATION: .Dragon Dictation After informed consent was obtained the patient was brought to the operating room and placed in the supine position on the operating room table. Preoperatively her right breast was examined with the neoprobe and I confirmed that the radioactive seed was in the right breast. After adequate induction of general anesthesia the patient's right breast was prepped with ChloraPrep, allowed to dry, and draped in usual sterile manner. I confirmed that the neoprobe was set to I-125. The seton was localized in the lower outer quadrant of the right breast. A radial type incision was made with a 15 blade knife overlying the radioactivity. This incision was carried through the skin and subcutaneous tissue sharply with the electrocautery. Once into the breast tissue we then used the neoprobe to guide [place] to the appropriate area. I then removed a circular portion of breast tissue around the site of radioactivity. This was done sharply with the electrocautery. We frequently reoriented ourselves to make sure that the radioactivity was in the center what we are taking out. Once the specimen was removed it was oriented with the appropriate  paint colors. The specimen was confirmed to have I-125 radioactivity in it once it was removed from the patient. A specimen radiograph was obtained that showed the clip and radioactive seed be in the specimen. We did look a little bit close on the superior and medial margin. Additional superior-medial margin was taken sharply with the electrocautery and marked with a short single stitch on the superior side, a long single stitch on the medial side, and a double stitch on the new true surgical margin. Hemostasis was achieved using Bovie electrocautery. The wound was irrigated with copious amounts of saline and infiltrated with quarter percent Marcaine. The deep layer the wound was then closed with interrupted 3 marked with stitches. The skin was then closed with interrupted 4-0 Monocryl subcuticular stitches. Before closing the cavity was marked on 4 sides with clips. Dermabond dressings were then applied. The patient tolerated the procedure well. At the end of the case all needle sponge and instrument counts were correct. The patient was then awakened and taken to recovery in stable condition. The specimen was then taken to pathology and confirmation was obtained that the radioactive seed was in the specimen.  PLAN OF CARE: Discharge to home after PACU  PATIENT DISPOSITION:  PACU - hemodynamically stable.   Delay start of Pharmacological VTE agent (>24hrs) due to surgical blood loss or risk of bleeding: not applicable

## 2013-11-04 NOTE — Anesthesia Procedure Notes (Signed)
Procedure Name: LMA Insertion Date/Time: 11/04/2013 2:09 PM Performed by: Melynda Ripple D Pre-anesthesia Checklist: Patient identified, Emergency Drugs available, Suction available and Patient being monitored Patient Re-evaluated:Patient Re-evaluated prior to inductionOxygen Delivery Method: Circle System Utilized Preoxygenation: Pre-oxygenation with 100% oxygen Intubation Type: IV induction Ventilation: Mask ventilation without difficulty LMA: LMA inserted LMA Size: 4.0 Number of attempts: 1 Airway Equipment and Method: bite block Placement Confirmation: positive ETCO2 Tube secured with: Tape Dental Injury: Teeth and Oropharynx as per pre-operative assessment

## 2013-11-04 NOTE — Transfer of Care (Signed)
Immediate Anesthesia Transfer of Care Note  Patient: Rachel Edwards  Procedure(s) Performed: Procedure(s): RIGHT BREAST LUMPECTOMY WITH RADIOACTIVE SEED LOCALIZATION (Right)  Patient Location: PACU  Anesthesia Type:General  Level of Consciousness: awake, alert  and oriented  Airway & Oxygen Therapy: Patient Spontanous Breathing and Patient connected to face mask oxygen  Post-op Assessment: Report given to PACU RN and Post -op Vital signs reviewed and stable  Post vital signs: Reviewed and stable  Complications: No apparent anesthesia complications

## 2013-11-04 NOTE — Discharge Instructions (Signed)

## 2013-11-04 NOTE — Anesthesia Postprocedure Evaluation (Signed)
Anesthesia Post Note  Patient: Rachel Edwards  Procedure(s) Performed: Procedure(s) (LRB): RIGHT BREAST LUMPECTOMY WITH RADIOACTIVE SEED LOCALIZATION (Right)  Anesthesia type: General  Patient location: PACU  Post pain: Pain level controlled  Post assessment: Patient's Cardiovascular Status Stable  Last Vitals:  Filed Vitals:   11/04/13 1545  BP: 126/92  Pulse: 91  Temp:   Resp: 13    Post vital signs: Reviewed and stable  Level of consciousness: alert  Complications: No apparent anesthesia complications

## 2013-11-05 ENCOUNTER — Encounter (HOSPITAL_BASED_OUTPATIENT_CLINIC_OR_DEPARTMENT_OTHER): Payer: Self-pay | Admitting: General Surgery

## 2013-11-05 NOTE — Addendum Note (Signed)
Addendum created 11/05/13 1110 by Tawni Millers, CRNA   Modules edited: Charges VN

## 2013-11-09 ENCOUNTER — Telehealth (INDEPENDENT_AMBULATORY_CARE_PROVIDER_SITE_OTHER): Payer: Self-pay | Admitting: General Surgery

## 2013-11-09 NOTE — Telephone Encounter (Signed)
Message copied by Flossie Buffy on Mon Nov 09, 2013  2:00 PM ------      Message from: Luella Cook III      Created: Mon Nov 09, 2013  1:39 PM       No residual dcis in breast. It was all removed by core bx ------

## 2013-11-09 NOTE — Telephone Encounter (Signed)
Called patient and informed her of the pathology results below.  Patient verbalized understanding.

## 2013-11-16 NOTE — Progress Notes (Signed)
Location of Breast Cancer:Right breast ductal carcinoma in-situ; right lower outer quadrant.  Histology per Pathology Report  11/04/2013 Diagnosis 1. Breast, lumpectomy, right - BENIGN BREAST PARENCHYMA. - THERE IS NO EVIDENCE OF MALIGNANCY. - SEE ONCOLOGY TABLE BELOW. 2. Breast, excision, superior and medial margins - BENIGN BREAST PARENCHYMA. - THERE IS NO EVIDENCE  Receptor Status: ER(+), PR (+), Her2-neu ()  Did patient present with symptoms (if so, please note symptoms) or was this found on screening mammography?:Found on screening mammogram,  Past/Anticipated interventions by surgeon, if CJA:RWPTY breast lumpectomy with radioactive seed localization by Dr.Paul Marlou Starks on 11/04/2013.  Past/Anticipated interventions by medical oncology, if any: Chemotherapy, pt has not seen med onc as of this date, no med onc appointment scheduled  Lymphedema issues, if any: none  Pain issues, if any: post op tenderness, soreness of right breast  SAFETY ISSUES:  Prior radiation? No   Pacemaker/ICD? No  Possible current pregnancy? No  Is the patient on methotrexate? no  Current Complaints / other details: Married. GX P2, first live birth age 64. Menarche age.Hormonal replacement therapy (premarin) approximately 20 years. No smoking history 2 to 3 drinks per week. NKDA    Arlyss Repress, RN 11/16/2013,12:59 PM

## 2013-11-18 ENCOUNTER — Encounter: Payer: Self-pay | Admitting: Radiation Oncology

## 2013-11-18 ENCOUNTER — Ambulatory Visit
Admission: RE | Admit: 2013-11-18 | Discharge: 2013-11-18 | Disposition: A | Discharge status: Home / Self Care | Payer: BC Managed Care – PPO | Source: Ambulatory Visit | Attending: Radiation Oncology | Admitting: Radiation Oncology

## 2013-11-18 VITALS — BP 134/88 | HR 97 | Temp 97.5°F | Resp 20 | Ht 66.0 in | Wt 186.1 lb

## 2013-11-18 DIAGNOSIS — C50519 Malignant neoplasm of lower-outer quadrant of unspecified female breast: Secondary | ICD-10-CM | POA: Diagnosis not present

## 2013-11-18 DIAGNOSIS — Z170 Estrogen receptor positive status [ER+]: Secondary | ICD-10-CM | POA: Diagnosis not present

## 2013-11-18 DIAGNOSIS — C50511 Malignant neoplasm of lower-outer quadrant of right female breast: Secondary | ICD-10-CM

## 2013-11-18 DIAGNOSIS — Z5189 Encounter for other specified aftercare: Secondary | ICD-10-CM | POA: Diagnosis not present

## 2013-11-18 NOTE — Progress Notes (Signed)
Please see the Nurse Progress Note in the MD Initial Consult Encounter for this patient. 

## 2013-11-18 NOTE — Progress Notes (Signed)
   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  She is suffering from side effects from menopause including vaginal dryness and hot flashes. She is healing well. Her specimen showed no residual DCIS. (on biopsy she was ER+, intermediate grade). She does not have an appointment with medical oncologist.  Allergies: No Known Allergies  Medications:  Current Outpatient Prescriptions  Medication Sig Dispense Refill  . aspirin 81 MG tablet Take 81 mg by mouth daily.      Marland Kitchen buPROPion (WELLBUTRIN XL) 300 MG 24 hr tablet Take 300 mg by mouth daily.       . busPIRone (BUSPAR) 15 MG tablet Take 15 mg by mouth 2 (two) times daily.       . calcium carbonate (OS-CAL) 600 MG TABS tablet Take 600 mg by mouth 2 (two) times daily with a meal.      . chlorpheniramine (CHLOR-TRIMETON) 4 MG tablet Take 4 mg by mouth 2 (two) times daily as needed for allergies.      Marland Kitchen CRESTOR 10 MG tablet       . fexofenadine (ALLEGRA) 30 MG tablet Take 30 mg by mouth daily.      Marland Kitchen lisinopril (PRINIVIL,ZESTRIL) 5 MG tablet       . Omega-3 Fatty Acids (OMEGA-3 FISH OIL PO) Take by mouth daily.      Marland Kitchen oxyCODONE-acetaminophen (ROXICET) 5-325 MG per tablet Take 1-2 tablets by mouth every 4 (four) hours as needed for severe pain.  50 tablet  0   No current facility-administered medications for this encounter.    Physical Exam:  Filed Vitals:   11/18/13 1401  BP: 134/88  Pulse: 97  Temp: 97.5 F (36.4 C)  TempSrc: Oral  Resp: 20  Height: 5\' 6"  (1.676 m)  Weight: 186 lb 1.6 oz (84.414 kg)   Well healed  IMPRESSION: Aman is a 63 y.o. female with DCIS completely removed by biopsy  PLAN:  We discussed that she should probably have a post lumpectomy  mammogram to document calcifications. We discussed that she is basically low risk (intermediate grade, small, non-palpable, age >36). I gave her a copy of the NCCN guidelines explaining this. We discussed that her benefit is probably low in terms of 2-3% improvement in local control. We discussed that she could have radiation or not and that was totally up to her. She could have radiation or antiestrogen treatment or both or none. We discussed referral to a specialist for management of her post menopausal symptoms. In the end, she decided not to pursue radiation which is reasonable. She sees Dr. Marlou Starks on Friday. I will refer back to medical oncology for discussion of antiestrogen treatment and refer to pelvic rehab for sexual dysfunction.     Thea Silversmith, MD

## 2013-11-19 ENCOUNTER — Telehealth: Payer: Self-pay | Admitting: *Deleted

## 2013-11-19 NOTE — Telephone Encounter (Signed)
[facility] PT TO INFRM HER OF HER MAMMO AT [facility] ON 12/01/13 @ 10:30A. PT AWARE AND IN AGREEMENT.

## 2013-11-20 ENCOUNTER — Ambulatory Visit (INDEPENDENT_AMBULATORY_CARE_PROVIDER_SITE_OTHER): Payer: BC Managed Care – PPO | Admitting: General Surgery

## 2013-11-20 ENCOUNTER — Other Ambulatory Visit: Payer: Self-pay | Admitting: Radiation Oncology

## 2013-11-20 ENCOUNTER — Encounter (INDEPENDENT_AMBULATORY_CARE_PROVIDER_SITE_OTHER): Payer: Self-pay | Admitting: General Surgery

## 2013-11-20 ENCOUNTER — Telehealth: Payer: Self-pay

## 2013-11-20 VITALS — BP 130/82 | HR 78 | Resp 14 | Ht 65.0 in | Wt 184.6 lb

## 2013-11-20 DIAGNOSIS — C50519 Malignant neoplasm of lower-outer quadrant of unspecified female breast: Secondary | ICD-10-CM

## 2013-11-20 DIAGNOSIS — C50511 Malignant neoplasm of lower-outer quadrant of right female breast: Secondary | ICD-10-CM

## 2013-11-20 NOTE — Telephone Encounter (Signed)
Informed patient that a referral has been made with Earlie Counts in pelvic rehabilitation for discussion of vaginal dryness and sexual dysfunction coming off premarin.I also told her someone will be calling with an appointment to see medical oncologist to discuss antiestrogen therapy.thankful for the call.

## 2013-11-20 NOTE — Patient Instructions (Signed)
Continue regular self exams  

## 2013-11-20 NOTE — Progress Notes (Signed)
Subjective:     Patient ID: Rachel Edwards, female   DOB: 1951-03-15, 63 y.o.   MRN: [id number]  HPI The patient is a 63 year old white female who is 2 weeks status post right breast lumpectomy for DCIS. She tolerated the surgery well and has no complaints today. Her final pathology showed no residual DCIS in the right breast which means that the entire area was removed on her core biopsy. She has met with radiation oncology and decided not to do any further radiation  Review of Systems     Objective:   Physical Exam On exam her right Breast incision is healing nicely with no sign of infection or significant seroma.    Assessment:     The patient is 2 weeks status post right lumpectomy for DCIS     Plan:     At this point I will refer her to medical oncology to talk about whether she should receive adjuvant hormonal therapy. She will continue to do regular self exams. I will plan to see her back in 3 months.

## 2013-11-20 NOTE — Progress Notes (Signed)
Referred to pelvic rehab for assessment and treatment of pelvic symptoms related to menopause.

## 2013-11-23 ENCOUNTER — Telehealth: Payer: Self-pay

## 2013-11-23 NOTE — Telephone Encounter (Signed)
[facility] [phone] notified to schedule appointment for pelvic rehab.scheduler will call patient today and try to get in next week.

## 2013-11-24 ENCOUNTER — Ambulatory Visit: Payer: BC Managed Care – PPO | Attending: Radiation Oncology | Admitting: Physical Therapy

## 2013-11-24 ENCOUNTER — Telehealth: Payer: Self-pay | Admitting: *Deleted

## 2013-11-24 DIAGNOSIS — M2420 Disorder of ligament, unspecified site: Secondary | ICD-10-CM | POA: Insufficient documentation

## 2013-11-24 DIAGNOSIS — C50519 Malignant neoplasm of lower-outer quadrant of unspecified female breast: Secondary | ICD-10-CM | POA: Insufficient documentation

## 2013-11-24 DIAGNOSIS — IMO0001 Reserved for inherently not codable concepts without codable children: Secondary | ICD-10-CM | POA: Insufficient documentation

## 2013-11-24 DIAGNOSIS — M629 Disorder of muscle, unspecified: Secondary | ICD-10-CM | POA: Insufficient documentation

## 2013-11-24 NOTE — Telephone Encounter (Signed)
Called and confirmed 12/04/13 appt w/ pt.  Mailed before appt letter, welcoming packet & intake form to pt.  Emailed Arts development officer at [employer] to make her aware.

## 2013-12-01 ENCOUNTER — Ambulatory Visit
Admission: RE | Admit: 2013-12-01 | Discharge: 2013-12-01 | Disposition: A | Discharge status: Home / Self Care | Payer: BC Managed Care – PPO | Source: Ambulatory Visit | Attending: Radiation Oncology | Admitting: Radiation Oncology

## 2013-12-01 ENCOUNTER — Other Ambulatory Visit: Payer: Self-pay | Admitting: Oncology

## 2013-12-01 DIAGNOSIS — C50511 Malignant neoplasm of lower-outer quadrant of right female breast: Secondary | ICD-10-CM

## 2013-12-03 ENCOUNTER — Encounter: Payer: Self-pay | Admitting: *Deleted

## 2013-12-03 NOTE — Progress Notes (Signed)
Completed chart, labs entered, added to spreadsheet and placed on Dr. Mariana Kaufman desk.

## 2013-12-04 ENCOUNTER — Other Ambulatory Visit (HOSPITAL_BASED_OUTPATIENT_CLINIC_OR_DEPARTMENT_OTHER): Payer: BC Managed Care – PPO

## 2013-12-04 ENCOUNTER — Encounter: Payer: Self-pay | Admitting: Oncology

## 2013-12-04 ENCOUNTER — Telehealth: Payer: Self-pay | Admitting: Oncology

## 2013-12-04 ENCOUNTER — Other Ambulatory Visit: Payer: Self-pay | Admitting: *Deleted

## 2013-12-04 ENCOUNTER — Ambulatory Visit: Payer: BC Managed Care – PPO

## 2013-12-04 ENCOUNTER — Ambulatory Visit (HOSPITAL_BASED_OUTPATIENT_CLINIC_OR_DEPARTMENT_OTHER): Payer: BC Managed Care – PPO | Admitting: Oncology

## 2013-12-04 VITALS — BP 142/85 | HR 105 | Temp 98.3°F | Resp 18 | Ht 65.0 in | Wt 184.2 lb

## 2013-12-04 DIAGNOSIS — C50511 Malignant neoplasm of lower-outer quadrant of right female breast: Secondary | ICD-10-CM

## 2013-12-04 DIAGNOSIS — E669 Obesity, unspecified: Secondary | ICD-10-CM

## 2013-12-04 DIAGNOSIS — E66811 Obesity, class 1: Secondary | ICD-10-CM

## 2013-12-04 DIAGNOSIS — D0590 Unspecified type of carcinoma in situ of unspecified breast: Secondary | ICD-10-CM

## 2013-12-04 DIAGNOSIS — Z170 Estrogen receptor positive status [ER+]: Secondary | ICD-10-CM

## 2013-12-04 HISTORY — DX: Obesity, unspecified: E66.9

## 2013-12-04 HISTORY — DX: Obesity, class 1: E66.811

## 2013-12-04 LAB — COMPREHENSIVE METABOLIC PANEL (CC13)
ALK PHOS: 96 U/L (ref 40–150)
ALT: 34 U/L (ref 0–55)
AST: 29 U/L (ref 5–34)
Albumin: 4.4 g/dL (ref 3.5–5.0)
Anion Gap: 13 mEq/L — ABNORMAL HIGH (ref 3–11)
BILIRUBIN TOTAL: 0.39 mg/dL (ref 0.20–1.20)
BUN: 12.3 mg/dL (ref 7.0–26.0)
CO2: 22 mEq/L (ref 22–29)
CREATININE: 0.9 mg/dL (ref 0.6–1.1)
Calcium: 10.4 mg/dL (ref 8.4–10.4)
Chloride: 106 mEq/L (ref 98–109)
Glucose: 136 mg/dl (ref 70–140)
Potassium: 4.1 mEq/L (ref 3.5–5.1)
Sodium: 141 mEq/L (ref 136–145)
Total Protein: 7.8 g/dL (ref 6.4–8.3)

## 2013-12-04 LAB — CBC WITH DIFFERENTIAL/PLATELET
BASO%: 0.3 % (ref 0.0–2.0)
Basophils Absolute: 0 10*3/uL (ref 0.0–0.1)
EOS%: 0 % (ref 0.0–7.0)
Eosinophils Absolute: 0 10*3/uL (ref 0.0–0.5)
HEMATOCRIT: 40.6 % (ref 34.8–46.6)
HGB: 13.5 g/dL (ref 11.6–15.9)
LYMPH#: 1.2 10*3/uL (ref 0.9–3.3)
LYMPH%: 14.4 % (ref 14.0–49.7)
MCH: 30.9 pg (ref 25.1–34.0)
MCHC: 33.2 g/dL (ref 31.5–36.0)
MCV: 93.2 fL (ref 79.5–101.0)
MONO#: 0.1 10*3/uL (ref 0.1–0.9)
MONO%: 1.5 % (ref 0.0–14.0)
NEUT#: 6.9 10*3/uL — ABNORMAL HIGH (ref 1.5–6.5)
NEUT%: 83.8 % — AB (ref 38.4–76.8)
Platelets: 273 10*3/uL (ref 145–400)
RBC: 4.36 10*6/uL (ref 3.70–5.45)
RDW: 12.7 % (ref 11.2–14.5)
WBC: 8.2 10*3/uL (ref 3.9–10.3)

## 2013-12-04 MED ORDER — TAMOXIFEN CITRATE 20 MG PO TABS: 20.0000 mg | ORAL_TABLET | Freq: Every day | ORAL | Status: DC

## 2013-12-04 NOTE — Telephone Encounter (Signed)
per pof to sch pt appt-sch & gave pt copy of sch °

## 2013-12-04 NOTE — Progress Notes (Signed)
Checked in new patient with no financial issues prior to seeing the dr. She wants to be billed for copy and she has appt card and not been out of country

## 2013-12-04 NOTE — Progress Notes (Signed)
[place] NEW PATIENT EVALUATION   Name: Rachel Edwards Date: 12/04/2013 MRN: [id number] DOB: May 12, 1951  REFERRING PHYSICIAN: Autumn Messing cc Moreen Fowler Leonie Green, MD; Thea Silversmith; Dory Horn; GI in [facility]    REASON FOR REFERRAL:    HISTORY OF PRESENT ILLNESS:Rachel Edwards is a 63 y.o. female who is seen in consultation, alone for visit, at the request of Dr Marlou Starks due to recently diagnosed DCIS of right breast.  Patient had screening mammograms done at Dr Juan Quam office, with calcifications seen on right. The report of the initial bilateral mammograms is not in this EMR. She had diagnostic right mammogram at [facility] on 10-01-13, which confirmed 7 mm cluster of calcifications right lower outer quadrant. Stereotactic biopsy at [facility] 10-06-2013 [phone]) showed intermediate grade DCIS 93mm, ER 100%, PR 100%. Patient stopped her long term Premarin around the time of that biopsy. MRI bilateral breasts at [place] 10-13-13 showed no enhancement bilateral breasts or lymph nodes, with post biopsy changes on right and incidental hiatal hernia. She had lumpectomy with radioactive seed localization by Dr Marlou Starks 11-04-2013, with no residual DCIS in the specimen ([id number]).  She saw Dr Pablo Ledger in consultation following the lumpectomy, with RT benefit felt to be 2-3% in local control for this low risk DCIS (intermediate grade, small, nonpalpable, age >61); after discussion and consideration, patient has elected no radiation therapy. She had follow up with Dr Marlou Starks on 11-20-13, and will see him again in 3 months.   REVIEW OF SYSTEMS as above, also: Hot flashes most bothersome at night since DC premarin, which she had taken since 1998, up x2 last pm with hot flashes. Some vaginal dryness also since off premarin. Occasional HA thought sinus related, some environmental allergies. Good visual acuity with reading glasses. No thyroid symptoms, no noted changes in breasts  otherwise, generally no respiratory problems tho has had asthma associated with acute bronchitis, no cardiac symptoms, weight stable. Has GERD generally food related, uses OTC zantac daily. No change in bowels, no bleeding, no hx blood clots, no bladder symptoms, occasional arthritis discomfort. Remainder of full 10 point review of systems negative.   ALLERGIES: Review of patient's allergies indicates no known allergies.  PAST MEDICAL/ SURGICAL HISTORY:    G2P2 first live birth age 47 Hysterectomy BSO 1998 for excessive bleeding HTN x 10 yrs, on lisinopril Elevated cholesterol x 15 years  Colonoscopy in [place] ~ 6 years ago, next recommended in 10 years Bilateral inguinal hernia repair age 89 Tonsillectomy childhood No history of blood clots, CVA, TIA Normal bone density scan per patient (not in this EMR)   CURRENT MEDICATIONS: reviewed as listed now in EMR. Recommended Vitamin E 400-800 mg at hs for hot flashes  PHARMACY [facility]   SOCIAL HISTORY: From [place], [place], in [place] since 1985. Lives with husband. 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 Never smoker. Occasional ETOH. Never transfused. Husband had RT by Dr Valere Dross for prostate cancer last year and anticipates aortic valve replacement upcoming. Daughter Nira Conn age 54 in [place], daughter Ebony Hail age 40 in [place] with one grandchild in [place].   FAMILY HISTORY:   Paternal grandmother with ovarian cancer Father died with MI age 80, heavy smoker Mother died pneumonia age 23 Siblings and daughters healthy         PHYSICAL EXAM:  height is 5\' 5"  (1.651 m) and weight is 184 lb 3.2 oz (83.553 kg). Her oral temperature is 98.3  F (36.8 C). Her blood pressure is 142/85 and her pulse is 105. Her respiration is 18.  BMI 30.7 Alert, pleasant, cooperative lady looks stated age, good historian, appears comfortable.  HEENT: PERRL, not icteric. Good dentition. Oral mucosa and  posterior pharynx clear. Neck supple without JVD or thyroid mass.   RESPIRATORY:lungs clear to A and P  CARDIAC/ VASCULAR: heart RRR without murmur or gallop. Peripheral pulses symmetrical and intact  ABDOMEN: soft, nontender, no HSM or mass, normally active BS, not distended  LYMPH NODES: no cervical, supraclavicular, axillary or inguinal adenopathy  BREASTS: left without dominant mass, skin or nipple findings. Right with lumpectomy incision lower outer quadrant with surgical glue in place, no dominant mass, no skin or nipple findings. Axillae benign.   NEUROLOGIC: CN, motor, sensory, cerebellar without focal deficits   SKIN: without rash, ecchymoses or petechiae  MUSCULOSKELETAL: back nontender. Extremities without edema, cords, tenderness. Symmetrical muscle mass    LABORATORY DATA:  Results for orders placed in visit on 12/04/13 (from the past 48 hour(s))  CBC WITH DIFFERENTIAL     Status: Abnormal   Collection Time    12/04/13 10:28 AM      Result Value Ref Range   WBC 8.2  3.9 - 10.3 10e3/uL   NEUT# 6.9 (*) 1.5 - 6.5 10e3/uL   HGB 13.5  11.6 - 15.9 g/dL   HCT 40.6  34.8 - 46.6 %   Platelets 273  145 - 400 10e3/uL   MCV 93.2  79.5 - 101.0 fL   MCH 30.9  25.1 - 34.0 pg   MCHC 33.2  31.5 - 36.0 g/dL   RBC 4.36  3.70 - 5.45 10e6/uL   RDW 12.7  11.2 - 14.5 %   lymph# 1.2  0.9 - 3.3 10e3/uL   MONO# 0.1  0.1 - 0.9 10e3/uL   Eosinophils Absolute 0.0  0.0 - 0.5 10e3/uL   Basophils Absolute 0.0  0.0 - 0.1 10e3/uL   NEUT% 83.8 (*) 38.4 - 76.8 %   LYMPH% 14.4  14.0 - 49.7 %   MONO% 1.5  0.0 - 14.0 %   EOS% 0.0  0.0 - 7.0 %   BASO% 0.3  0.0 - 2.0 %  COMPREHENSIVE METABOLIC PANEL (FY10)     Status: Abnormal   Collection Time    12/04/13 10:28 AM      Result Value Ref Range   Sodium 141  136 - 145 mEq/L   Potassium 4.1  3.5 - 5.1 mEq/L   Chloride 106  98 - 109 mEq/L   CO2 22  22 - 29 mEq/L   Glucose 136  70 - 140 mg/dl   BUN 12.3  7.0 - 26.0 mg/dL   Creatinine 0.9  0.6  - 1.1 mg/dL   Total Bilirubin 0.39  0.20 - 1.20 mg/dL   Alkaline Phosphatase 96  40 - 150 U/L   AST 29  5 - 34 U/L   ALT 34  0 - 55 U/L   Total Protein 7.8  6.4 - 8.3 g/dL   Albumin 4.4  3.5 - 5.0 g/dL   Calcium 10.4  8.4 - 10.4 mg/dL   Anion Gap 13 (*) 3 - 11 mEq/L      PATHOLOGY: : Edwards, Rachel T Collected: 10/06/2013 Client: [facility] Accession: [id number] Received: 10/06/2013 F. Altamese Cabal, MD REPORT OF SURGICAL PATHOLOGY ADDITIONAL INFORMATION: PROGNOSTIC INDICATORS - ACIS Results: IMMUNOHISTOCHEMICAL AND MORPHOMETRIC ANALYSIS BY THE AUTOMATED CELLULAR IMAGING SYSTEM (ACIS) Estrogen  Receptor: 100%, POSITIVE, STRONG STAINING INTENSITY Progesterone Receptor: 100%, POSITIVE, STRONG STAINING INTENSITY REFERENCE RANGE ESTROGEN RECEPTOR NEGATIVE <1% POSITIVE =>1% PROGESTERONE RECEPTOR NEGATIVE <1% POSITIVE =>1% All controls stained appropriately Enid Cutter MD Pathologist, Electronic Signature ( Signed 10/14/2013) FINAL DIAGNOSIS Diagnosis Breast, right, needle core biopsy, right lower outer quadrant - DUCTAL CARCINOMA IN SITU WITH CALCIFICATIONS. Microscopic Comment There is intermediate grade ductal carcinoma in situ with calcifications. Estrogen and progesterone receptors will be performed.     Patient: Rachel Edwards, Rachel Edwards Collected: 11/04/2013 Client: [facility] Accession: [id number] Received: 11/04/2013 Autumn Messing, MDT OF SURGICAL PATHOLOGY FINAL DIAGNOSIS Diagnosis 1. Breast, lumpectomy, right - BENIGN BREAST PARENCHYMA. - THERE IS NO EVIDENCE OF MALIGNANCY. - SEE ONCOLOGY TABLE BELOW. 2. Breast, excision, superior and medial margins - BENIGN BREAST PARENCHYMA. - THERE IS NO EVIDENCE OF MALIGNANCY. - SEE COMMENT. Microscopic Comment 1. BREAST, IN SITU CARCINOMA Breast: In situ carcinoma, right (based on previous needle core [id number]). Specimen, including laterality: Right breast. Procedure (include  lymph node sampling sentinel-non-sentinel: Core needle biopsy, lumpectomy, and additional superior and medial margin resection. Grade of carcinoma: Intermediate grade. Necrosis: Present. Estimated tumor size: (glass slide measurement): 0.2 cm Treatment effect: N/A Distance to closest margin: Greater than 0.2 cm to all margins. Breast prognostic profile: Case [id number]. Estrogen receptor: 100%, strong staining intensity. Progesterone receptor: 100%, strong staining intensity. Lymph nodes: Not examined. TNM: pTis, pNX Comments: Histologic evaluation of the current specimen reveals benign breast parenchyma with fibrocystic changes and healing biopsy site. Ductal carcinoma in situ is not identified in the current specimen. Review of the patient's previous core biopsy ([id number]), does confirm the presence of intermediate grade ductal carcinoma in situ with associated calcifications. (JBK:ecj 11/09/2013) 2. The surgical resection margins have been inked and microscopically evaluated.    RADIOGRAPHY: DIGITAL DIAGNOSTIC right MAMMOGRAM 10-01-2013 COMPARISON: Prior exams  ACR Breast Density Category b: There are scattered areas of  fibroglandular density.  FINDINGS:  Additional views confirm the presence of a 7 mm cluster of amorphous  calcifications in the right lower outer quadrant, corresponding to  the screening mammographic finding.  IMPRESSION:  Suspicious right breast calcifications, lower outer quadrant.  Stereotactic core needle biopsy is recommended. This will be  scheduled at the patient's convenience.  RECOMMENDATION:  Right stereotactic core needle biopsy  I have discussed the findings and recommendations with the patient.  Results were also provided in writing at the conclusion of the  visit. If applicable, a reminder letter will be sent to the patient  regarding the next appointment.  BI-RADS CATEGORY 4: Suspicious.   BILATERAL BREAST MRI WITH AND WITHOUT  CONTRAST 10-13-2013  COMPARISON: Previous examinations.  FINDINGS:  Breast composition: Density B: Scattered fibroglandular.  Background parenchymal enhancement: Minimal  Right breast: Post biopsy changes present within the lower outer  quadrant of the middle 1/3 of the right breast related to patient's  recent stereotactic biopsy with central clip artifact. There is no  worrisome enhancement within the right breast.  Left breast: No mass or abnormal enhancement.  Lymph nodes: No abnormal appearing lymph nodes.  Ancillary findings: There is a moderate sized hiatal hernia  incidentally noted.  IMPRESSION:  Post biopsy change associated with the lower outer quadrant of the  right breast. No worrisome areas of enhancement within either breast  and no evidence for adenopathy. Incidental hiatal hernia noted.   Breast Surgical Specimen 11-06-13  CLINICAL DATA: 63 year old female post right breast lumpectomy for  ductal carcinoma in situ.  EXAM:  SPECIMEN RADIOGRAPH OF THE RIGHT BREAST  COMPARISON: Previous exam(s)  FINDINGS:  Status post excision of the right breast. The radioactive seed and  biopsy marker clip are present and are marked for pathology.  IMPRESSION:  Specimen radiograph of the right breast         DISCUSSION: We have reviewed circumstances surrounding diagnosis, evaluation and surgical procedures done, and pathology information as above. We have discussed role of estrogen receptors in normal breast tissue and in DCIS and invasive cancers. Certainly she has already accomplished a major hormonal alteration by stopping the long term premarin. We have discussed fact that the present DCIS will not be a problem in future, but that she may be at somewhat higher risk of a second primary breast cancer given this history. We have discussed lifestyle interventions for breast cancer prevention, including stopping the supplemental estrogen, weight loss to ideal, regular exercise and  healthy diet high in fruits and vegetables. She has gotten a Ecologist and is interested in recommendation to increase activity by 5000 steps daily above her baseline. We have discussed history of tamoxifen use for both invasive cancers and DCIS, including mechanism of action and possible side effects, which may include exacerbation of hot flashes, some initial nausea, elevation of lipids, increased risk of blood clots. Risk of uterine cancer is not a consideration due to previous hysterectomy. She understands that tamoxifen may decrease risk of second primary hormone positive cancer by ~ 50%, with rough estimate that her risk of a second cancer is ~ 1% per year without tamoxifen. She understands that preventative tamoxifen would be given x 5 years, tho certainly she could stop prior if she decided to do that.  Patient tells me that she had researched tamoxifen prevention prior to this visit and does want to try that. Even with present hot flashes and summer season, she prefers to start now while she is on break from school.     IMPRESSION / PLAN:  1. ER PR positive intermediate grade DCIS: 39mm disease removed with core needle biopsy, with excellent margins by virtue of the lumpectomy specimen. No radiation to be used. She would like to begin tamoxifen in preventative attempt. I will see her back in 6-8 weeks in follow up of the tamoxifen. 2. Long term use of premarin, DCd with this diagnosis 3.obesity: she seems motivated to address diet and increase exercise, which I have encouraged 4.post  Hysterectomy and BSO 1998 for benign indications 5.elevated lipids, controlled with medication. Weight loss would benefit 6.HTN on medication. Weight loss would benefit 7.GERD: suggested increase in OTC zantac to 2 daily. May need prescription if this is helpful, may need f/u with GI    Patient has had questions answered to her satisfaction and is in agreement with plan above. She can contact this office for  questions or concerns at any time prior to next scheduled visit.  Time spent  95min, including >50% discussion and coordination of care.    Gordy Levan, MD 12/04/2013 4:35 PM

## 2013-12-21 ENCOUNTER — Ambulatory Visit: Payer: BC Managed Care – PPO | Attending: Radiation Oncology | Admitting: Physical Therapy

## 2013-12-21 DIAGNOSIS — IMO0001 Reserved for inherently not codable concepts without codable children: Secondary | ICD-10-CM | POA: Insufficient documentation

## 2013-12-21 DIAGNOSIS — C50519 Malignant neoplasm of lower-outer quadrant of unspecified female breast: Secondary | ICD-10-CM | POA: Insufficient documentation

## 2013-12-21 DIAGNOSIS — M629 Disorder of muscle, unspecified: Secondary | ICD-10-CM | POA: Insufficient documentation

## 2013-12-21 DIAGNOSIS — M2420 Disorder of ligament, unspecified site: Secondary | ICD-10-CM | POA: Insufficient documentation

## 2014-01-04 ENCOUNTER — Ambulatory Visit: Payer: BC Managed Care – PPO | Attending: Radiation Oncology | Admitting: Physical Therapy

## 2014-01-04 DIAGNOSIS — M629 Disorder of muscle, unspecified: Secondary | ICD-10-CM | POA: Diagnosis not present

## 2014-01-04 DIAGNOSIS — IMO0001 Reserved for inherently not codable concepts without codable children: Secondary | ICD-10-CM | POA: Diagnosis not present

## 2014-01-04 DIAGNOSIS — C50519 Malignant neoplasm of lower-outer quadrant of unspecified female breast: Secondary | ICD-10-CM | POA: Diagnosis not present

## 2014-01-04 DIAGNOSIS — M2420 Disorder of ligament, unspecified site: Secondary | ICD-10-CM | POA: Diagnosis not present

## 2014-01-13 ENCOUNTER — Ambulatory Visit: Payer: BC Managed Care – PPO | Admitting: Physical Therapy

## 2014-01-13 DIAGNOSIS — IMO0001 Reserved for inherently not codable concepts without codable children: Secondary | ICD-10-CM | POA: Diagnosis not present

## 2014-01-23 ENCOUNTER — Other Ambulatory Visit: Payer: Self-pay | Admitting: Oncology

## 2014-01-23 DIAGNOSIS — C50511 Malignant neoplasm of lower-outer quadrant of right female breast: Secondary | ICD-10-CM

## 2014-01-27 ENCOUNTER — Ambulatory Visit (HOSPITAL_BASED_OUTPATIENT_CLINIC_OR_DEPARTMENT_OTHER): Payer: BC Managed Care – PPO | Admitting: Oncology

## 2014-01-27 ENCOUNTER — Encounter: Payer: Self-pay | Admitting: Oncology

## 2014-01-27 ENCOUNTER — Other Ambulatory Visit (HOSPITAL_BASED_OUTPATIENT_CLINIC_OR_DEPARTMENT_OTHER): Payer: BC Managed Care – PPO

## 2014-01-27 VITALS — BP 138/80 | HR 101 | Temp 98.4°F | Resp 22 | Ht 65.0 in | Wt 181.3 lb

## 2014-01-27 DIAGNOSIS — C50511 Malignant neoplasm of lower-outer quadrant of right female breast: Secondary | ICD-10-CM

## 2014-01-27 DIAGNOSIS — D0511 Intraductal carcinoma in situ of right breast: Secondary | ICD-10-CM

## 2014-01-27 DIAGNOSIS — Z79810 Long term (current) use of selective estrogen receptor modulators (SERMs): Secondary | ICD-10-CM

## 2014-01-27 DIAGNOSIS — D0590 Unspecified type of carcinoma in situ of unspecified breast: Secondary | ICD-10-CM

## 2014-01-27 DIAGNOSIS — Z170 Estrogen receptor positive status [ER+]: Secondary | ICD-10-CM

## 2014-01-27 LAB — COMPREHENSIVE METABOLIC PANEL (CC13)
ALBUMIN: 4 g/dL (ref 3.5–5.0)
ALT: 18 U/L (ref 0–55)
ANION GAP: 12 meq/L — AB (ref 3–11)
AST: 22 U/L (ref 5–34)
Alkaline Phosphatase: 74 U/L (ref 40–150)
BUN: 11.8 mg/dL (ref 7.0–26.0)
CALCIUM: 9.6 mg/dL (ref 8.4–10.4)
CHLORIDE: 107 meq/L (ref 98–109)
CO2: 22 meq/L (ref 22–29)
CREATININE: 1 mg/dL (ref 0.6–1.1)
GLUCOSE: 148 mg/dL — AB (ref 70–140)
POTASSIUM: 3.7 meq/L (ref 3.5–5.1)
Sodium: 141 mEq/L (ref 136–145)
Total Bilirubin: 0.36 mg/dL (ref 0.20–1.20)
Total Protein: 7 g/dL (ref 6.4–8.3)

## 2014-01-27 NOTE — Progress Notes (Signed)
OFFICE PROGRESS NOTE   01/27/2014   Physicians:Toth, Roosvelt Harps, Leonie Green, MD; Thea Silversmith; Dory Horn; GI in [facility]  INTERVAL HISTORY:   Patient is seen, together with husband, now on tamoxifen since 12-05-13 for DCIS right breast. She had significant hot flashes prior to starting tamoxifen, but cannot tell that these are increased and is tolerating them. She had hysterectomy with BSO in 1998 for benign indications, still sees Dr Nori Riis yearly in April. She has no history of blood clots and no symptoms of those. Otherwise she really cannot tell that she is on the tamoxifen.  She has healed well from lumpectomy and will see Dr Marlou Starks late Sept. She did not need RT.  ONCOLOGIC HISTORY Patient had screening mammograms done at Dr Juan Quam office, with calcifications seen on right. The report of the initial bilateral mammograms is not in this EMR. She had diagnostic right mammogram at [facility] on 10-01-13, which confirmed 7 mm cluster of calcifications right lower outer quadrant. Stereotactic biopsy at [facility] 10-06-2013 [phone]) showed intermediate grade DCIS 15mm, ER 100%, PR 100%. Patient stopped her long term Premarin around the time of that biopsy. MRI bilateral breasts at [facility] 10-13-13 showed no enhancement bilateral breasts or lymph nodes, with post biopsy changes on right and incidental hiatal hernia.  She had lumpectomy with radioactive seed localization by Dr Marlou Starks 11-04-2013, with no residual DCIS in the specimen ([id number]).  She saw Dr Pablo Ledger in consultation following the lumpectomy, with RT benefit felt to be 2-3% in local control for this low risk DCIS (intermediate grade, small, nonpalpable, age >33); after discussion and consideration, patient has elected no radiation therapy. She began tamoxifen 12-05-2013.  Review of systems as above, also: No fever or symptoms of infection. No respiratory, GI or musculoskeletal complaints.Appetite and energy good.  She is to begin pelvic PT later this month. Remainder of 10 point Review of Systems negative.  Husband had aortic valve replacement and aneurysm repair 2 weeks ago.  Objective:  Vital signs in last 24 hours:  BP 138/80  Pulse 101  Temp(Src) 98.4 F (36.9 C)  Resp 22  Ht 5\' 5"  (1.651 m)  Wt 181 lb 4.8 oz (82.237 kg)  BMI 30.17 kg/m2 weight is down 3 lbs.  Alert, oriented and appropriate. Easily mobile.  HEENT:PERRL, sclerae not icteric. Oral mucosa moist without lesions.  Neck supple. No JVD.  Lymphatics:no cervical,suraclavicular, axillary adenopathy Resp: clear to auscultation bilaterally and normal percussion bilaterally Cardio: regular rate and rhythm. No gallop. GI: soft, nontender, not distended, no mass or organomegaly. Normally active bowel sounds.  Musculoskeletal/ Extremities: without pitting edema, cords, tenderness Neuro: nonfocal  PSYCH normal mood and affect Skin without rash, ecchymosis, petechiae Breasts: Right lumpectomy scar well healed, otherwise bilaterally without dominant mass, skin or nipple findings. Axillae benign. No swelling UE   Lab Results:  Results for orders placed in visit on 01/27/14  COMPREHENSIVE METABOLIC PANEL (DG38)      Result Value Ref Range   Sodium 141  136 - 145 mEq/L   Potassium 3.7  3.5 - 5.1 mEq/L   Chloride 107  98 - 109 mEq/L   CO2 22  22 - 29 mEq/L   Glucose 148 (*) 70 - 140 mg/dl   BUN 11.8  7.0 - 26.0 mg/dL   Creatinine 1.0  0.6 - 1.1 mg/dL   Total Bilirubin 0.36  0.20 - 1.20 mg/dL   Alkaline Phosphatase 74  40 - 150 U/L   AST  22  5 - 34 U/L   ALT 18  0 - 55 U/L   Total Protein 7.0  6.4 - 8.3 g/dL   Albumin 4.0  3.5 - 5.0 g/dL   Calcium 9.6  8.4 - 10.4 mg/dL   Anion Gap 12 (*) 3 - 11 mEq/L   chemistries not fasting  Studies/Results:  No results found.  Medications: I have reviewed the patient's current medications. Continue tamoxifen 20 mg daily  DISCUSSION: reviewed possible side effects from  tamoxifen  Assessment/Plan:  1. ER PR positive intermediate grade DCIS: 6mm disease removed with core needle biopsy, with excellent margins by virtue of the lumpectomy specimen. No radiation to be used. On tamoxifen in preventative attempt since ~ 12-05-2013. She will see Dr Marlou Starks as scheduled and will be due bilateral mammograms in ~ April (those done at Dr Verlon Au office April 2015 and I will request report for this EMR, to include date of imaging). I will see her back in 6 months 2. Long term use of premarin, DCd with this diagnosis  3.obesity: weight down 3 lbs intentionally 4.post Hysterectomy and BSO 1998 for benign indications  5.elevated lipids, controlled with medication. Weight loss would benefit  6.HTN on medication. Weight loss would benefit  7.hx GERD: has used OTC zantac   Patient is in agreement with plan and knows to call if needed prior to scheduled appointment. Cc PCP and Dr Janine Limbo, MD   01/27/2014, 9:37 PM

## 2014-01-29 ENCOUNTER — Telehealth: Payer: Self-pay | Admitting: Oncology

## 2014-01-29 NOTE — Telephone Encounter (Signed)
per pof to sch appt-[facility] not open for march 2016-printed & gave to [employer]

## 2014-02-09 ENCOUNTER — Ambulatory Visit: Payer: BC Managed Care – PPO | Attending: Radiation Oncology | Admitting: Physical Therapy

## 2014-02-09 DIAGNOSIS — M2420 Disorder of ligament, unspecified site: Secondary | ICD-10-CM | POA: Insufficient documentation

## 2014-02-09 DIAGNOSIS — IMO0001 Reserved for inherently not codable concepts without codable children: Secondary | ICD-10-CM | POA: Insufficient documentation

## 2014-02-09 DIAGNOSIS — C50519 Malignant neoplasm of lower-outer quadrant of unspecified female breast: Secondary | ICD-10-CM | POA: Diagnosis not present

## 2014-02-09 DIAGNOSIS — M629 Disorder of muscle, unspecified: Secondary | ICD-10-CM | POA: Insufficient documentation

## 2014-02-22 ENCOUNTER — Ambulatory Visit (INDEPENDENT_AMBULATORY_CARE_PROVIDER_SITE_OTHER): Payer: BC Managed Care – PPO | Admitting: General Surgery

## 2014-03-04 ENCOUNTER — Ambulatory Visit: Payer: BC Managed Care – PPO | Attending: Radiation Oncology | Admitting: Physical Therapy

## 2014-03-04 DIAGNOSIS — Z9889 Other specified postprocedural states: Secondary | ICD-10-CM | POA: Insufficient documentation

## 2014-03-04 DIAGNOSIS — Z90710 Acquired absence of both cervix and uterus: Secondary | ICD-10-CM | POA: Insufficient documentation

## 2014-03-04 DIAGNOSIS — R531 Weakness: Secondary | ICD-10-CM | POA: Insufficient documentation

## 2014-03-04 DIAGNOSIS — I10 Essential (primary) hypertension: Secondary | ICD-10-CM | POA: Insufficient documentation

## 2014-03-04 DIAGNOSIS — C50511 Malignant neoplasm of lower-outer quadrant of right female breast: Secondary | ICD-10-CM | POA: Diagnosis not present

## 2014-03-04 DIAGNOSIS — N9489 Other specified conditions associated with female genital organs and menstrual cycle: Secondary | ICD-10-CM | POA: Diagnosis present

## 2014-03-13 ENCOUNTER — Telehealth: Payer: Self-pay | Admitting: Oncology

## 2014-03-13 NOTE — Telephone Encounter (Signed)
, °

## 2014-03-14 ENCOUNTER — Other Ambulatory Visit: Payer: Self-pay | Admitting: Oncology

## 2014-03-14 DIAGNOSIS — C50511 Malignant neoplasm of lower-outer quadrant of right female breast: Secondary | ICD-10-CM

## 2014-04-15 ENCOUNTER — Other Ambulatory Visit: Payer: Self-pay | Admitting: Oncology

## 2014-05-03 ENCOUNTER — Telehealth: Payer: Self-pay | Admitting: Oncology

## 2014-05-03 NOTE — Telephone Encounter (Signed)
lvm for pt regarding to d/t change of March appt....mailed pt appt sched/letter

## 2014-06-07 ENCOUNTER — Other Ambulatory Visit: Payer: Self-pay | Admitting: Dermatology

## 2014-07-28 ENCOUNTER — Other Ambulatory Visit: Payer: Self-pay

## 2014-07-28 ENCOUNTER — Other Ambulatory Visit: Payer: BC Managed Care – PPO

## 2014-07-28 ENCOUNTER — Ambulatory Visit: Payer: BC Managed Care – PPO | Admitting: Oncology

## 2014-07-28 ENCOUNTER — Other Ambulatory Visit: Payer: Self-pay | Admitting: Oncology

## 2014-07-28 DIAGNOSIS — C50511 Malignant neoplasm of lower-outer quadrant of right female breast: Secondary | ICD-10-CM

## 2014-07-29 ENCOUNTER — Other Ambulatory Visit (HOSPITAL_BASED_OUTPATIENT_CLINIC_OR_DEPARTMENT_OTHER): Payer: BC Managed Care – PPO

## 2014-07-29 ENCOUNTER — Telehealth: Payer: Self-pay | Admitting: Oncology

## 2014-07-29 ENCOUNTER — Encounter: Payer: Self-pay | Admitting: Oncology

## 2014-07-29 ENCOUNTER — Ambulatory Visit (HOSPITAL_BASED_OUTPATIENT_CLINIC_OR_DEPARTMENT_OTHER): Payer: BC Managed Care – PPO | Admitting: Oncology

## 2014-07-29 VITALS — BP 133/79 | HR 79 | Temp 98.0°F | Resp 18 | Ht 65.0 in | Wt 172.0 lb

## 2014-07-29 DIAGNOSIS — D0511 Intraductal carcinoma in situ of right breast: Secondary | ICD-10-CM | POA: Insufficient documentation

## 2014-07-29 DIAGNOSIS — Z79810 Long term (current) use of selective estrogen receptor modulators (SERMs): Secondary | ICD-10-CM

## 2014-07-29 DIAGNOSIS — C50511 Malignant neoplasm of lower-outer quadrant of right female breast: Secondary | ICD-10-CM

## 2014-07-29 DIAGNOSIS — Z170 Estrogen receptor positive status [ER+]: Secondary | ICD-10-CM

## 2014-07-29 HISTORY — DX: Intraductal carcinoma in situ of right breast: D05.11

## 2014-07-29 LAB — COMPREHENSIVE METABOLIC PANEL (CC13)
ALBUMIN: 4 g/dL (ref 3.5–5.0)
ALK PHOS: 76 U/L (ref 40–150)
ALT: 12 U/L (ref 0–55)
AST: 16 U/L (ref 5–34)
Anion Gap: 11 mEq/L (ref 3–11)
BILIRUBIN TOTAL: 0.33 mg/dL (ref 0.20–1.20)
BUN: 15.8 mg/dL (ref 7.0–26.0)
CO2: 25 meq/L (ref 22–29)
Calcium: 9.8 mg/dL (ref 8.4–10.4)
Chloride: 104 mEq/L (ref 98–109)
Creatinine: 0.9 mg/dL (ref 0.6–1.1)
EGFR: 72 mL/min/{1.73_m2} — ABNORMAL LOW (ref >90)
GLUCOSE: 103 mg/dL (ref 70–140)
POTASSIUM: 4.1 meq/L (ref 3.5–5.1)
Sodium: 140 mEq/L (ref 136–145)
TOTAL PROTEIN: 6.8 g/dL (ref 6.4–8.3)

## 2014-07-29 LAB — CBC WITH DIFFERENTIAL/PLATELET
BASO%: 0.4 % (ref 0.0–2.0)
Basophils Absolute: 0 10*3/uL (ref 0.0–0.1)
EOS ABS: 0.1 10*3/uL (ref 0.0–0.5)
EOS%: 0.8 % (ref 0.0–7.0)
HCT: 38.8 % (ref 34.8–46.6)
HGB: 13 g/dL (ref 11.6–15.9)
LYMPH#: 2.9 10*3/uL (ref 0.9–3.3)
LYMPH%: 32 % (ref 14.0–49.7)
MCH: 31.4 pg (ref 25.1–34.0)
MCHC: 33.5 g/dL (ref 31.5–36.0)
MCV: 93.7 fL (ref 79.5–101.0)
MONO#: 0.6 10*3/uL (ref 0.1–0.9)
MONO%: 7 % (ref 0.0–14.0)
NEUT#: 5.3 10*3/uL (ref 1.5–6.5)
NEUT%: 59.8 % (ref 38.4–76.8)
Platelets: 210 10*3/uL (ref 145–400)
RBC: 4.14 10*6/uL (ref 3.70–5.45)
RDW: 12.6 % (ref 11.2–14.5)
WBC: 8.9 10*3/uL (ref 3.9–10.3)

## 2014-07-29 NOTE — Progress Notes (Signed)
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 She is tolerating the tamoxifen generally well and has noticed no changes on breast self exam. Last bilateral mammograms were at Dr Juan Quam office 09-23-13. She is post hysterectomy with BSO 1998 for benign indications.  Patient has felt generally well since she was here 6 months ago. No changes noted on breast self exam. She has more urinary urgency and some urge incontinence, which has been more noticeable since she stopped Premarin, no dysuria or other symptoms of UTI; pelvic physical therapy apparently was not helpful. Hot flashes wake her ~ 3x nightly, not too bothersome during day. She has some slight vision changes with less acuity intermittently, is overdue for eye exam, which is recommended now. She is working out at gym 3x weekly and has changed diet since husband's cardiac problems, has lost another 9 lbs since last visit.    ONCOLOGIC HISTORY Patient had bilateral screening mammograms done at Dr Juan Quam office 09-23-2013, with calcifications seen on right.  She had diagnostic right mammogram at [facility] on 10-01-13, which confirmed 7 mm cluster of calcifications right lower outer quadrant. 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 Patient stopped her long term Premarin around the time of that biopsy. [facility] [place] [facility] showed no enhancement bilateral breasts or lymph nodes, with post biopsy changes on right and incidental hiatal hernia.  She had lumpectomy with radioactive seed localization by Dr TMarlou Starks6-02-2014, with no residual DCIS in the specimen (([id number].  She saw Dr WPablo Ledgerin consultation following the  lumpectomy, with RT benefit felt to be 2-3% in local control for this low risk DCIS (intermediate grade, small, nonpalpable, age >>61; after discussion and consideration, patient has elected no radiation therapy. She began tamoxifen 12-05-2013.   Review of systems as above, also: No fever or symptoms of infection. No SOB, no new or different pain, bowels moving well, no GERD since diet changes.  Remainder of 10 point Review of Systems negative.  Objective:  Vital signs in last 24 hours:  BP 133/79 mmHg  Pulse 79  Temp(Src) 98 F (36.7 C) (Oral)  Resp 18  Ht 5' 5"  (1.651 m)  Wt 172 lb (78.019 kg)  BMI 28.62 kg/m2 Weight is down from 181 lb 5 oz in 01-2014.  Alert, oriented and appropriate. Ambulatory without difficulty.  HEENT:PERRL, sclerae not icteric. Oral mucosa moist without lesions, posterior pharynx clear.  Neck supple. No JVD.  Lymphatics:no cervical,supraclavicular, axillary adenopathy Resp: clear to auscultation bilaterally and normal percussion bilaterally Cardio: regular rate and rhythm. No gallop. GI: abdomen soft, nontender, not distended, no mass or organomegaly. Normally active bowel sounds.  Musculoskeletal/ Extremities: without pitting edema, cords, tenderness UE or LE Neuro: nonfocal PSYCH appropriate mood and affect Skin without rash, ecchymosis, petechiae Breasts: right with well healed excisional biopsy scar lower outer and no findings of concern there, otherwise bilaterally without dominant mass, skin or nipple findings. Axillae benign.   Lab Results:  Results for orders placed or performed in visit on 07/29/14  CBC with Differential  Result Value Ref Range   WBC 8.9 3.9 - 10.3 10e3/uL   NEUT# 5.3 1.5 - 6.5 10e3/uL   HGB 13.0 11.6 - 15.9  g/dL   HCT 38.8 34.8 - 46.6 %   Platelets 210 145 - 400 10e3/uL   MCV 93.7 79.5 - 101.0 fL   MCH 31.4 25.1 - 34.0 pg   MCHC 33.5 31.5 - 36.0 g/dL   RBC 4.14 3.70 - 5.45 10e6/uL   RDW 12.6 11.2 - 14.5 %   lymph# 2.9  0.9 - 3.3 10e3/uL   MONO# 0.6 0.1 - 0.9 10e3/uL   Eosinophils Absolute 0.1 0.0 - 0.5 10e3/uL   Basophils Absolute 0.0 0.0 - 0.1 10e3/uL   NEUT% 59.8 38.4 - 76.8 %   LYMPH% 32.0 14.0 - 49.7 %   MONO% 7.0 0.0 - 14.0 %   EOS% 0.8 0.0 - 7.0 %   BASO% 0.4 0.0 - 2.0 %  Comprehensive metabolic panel (Cmet) - [facility]  Result Value Ref Range   Sodium 140 136 - 145 mEq/L   Potassium 4.1 3.5 - 5.1 mEq/L   Chloride 104 98 - 109 mEq/L   CO2 25 22 - 29 mEq/L   Glucose 103 70 - 140 mg/dl   BUN 15.8 7.0 - 26.0 mg/dL   Creatinine 0.9 0.6 - 1.1 mg/dL   Total Bilirubin 0.33 0.20 - 1.20 mg/dL   Alkaline Phosphatase 76 40 - 150 U/L   AST 16 5 - 34 U/L   ALT 12 0 - 55 U/L   Total Protein 6.8 6.4 - 8.3 g/dL   Albumin 4.0 3.5 - 5.0 g/dL   Calcium 9.8 8.4 - 10.4 mg/dL   Anion Gap 11 3 - 11 mEq/L   EGFR 72 (L) >90 ml/min/1.73 m2     Studies/Results:  No results found. She will need bilateral mammograms at [facility] just after 09-24-2014, ordered now.  Medications: I have reviewed the patient's current medications. She is comfortable continuing tamoxifen as planned.  DISCUSSION: all concerns above discussed. I have encouraged her to continue diet and exercise, as weight loss to ideal may be a very beneficial intervention from standpoint of breast cancer risk.  Assessment/Plan:  1. ER PR positive intermediate grade DCIS: 25m disease removed with core needle biopsy, with excellent margins by virtue of the lumpectomy specimen. No radiation to be used. On tamoxifen in preventative attempt since ~ 12-05-2013. She will have bilateral mammograms in early May, diagnostic at [facility] last done at Dr NVerlon Auoffice 4-29- 2015 with report scanned into this EMR. I will see her back in 6 months or sooner if needed. Plan tamoxifen x 5 years. 2. Long term use of premarin, DCd with this diagnosis. Hot flashes tolerable.  3.obesity: weight down 3 lbs intentionally 4.post Hysterectomy and BSO 1998 for benign  indications  5.elevated lipids, controlled with medication. Weight loss may benefit 6.HTN on medication. Weight loss may benefit 7.hx GERD: none since she has changed diet and lost weight 8.intentional weight loss, ongoing, with good diet and exercise.  All questions answered and patient is in agreement with plans and recommendations above.  Time spent 25 min including >50% counseling and coordination of care. CC this note Drs SLeodis Liverpool    LIVESAY,LENNIS P, MD   07/29/2014, 3:35 PM

## 2014-07-29 NOTE — Telephone Encounter (Signed)
per pof to sch pt appt-sch pt mamma-pt aware of time & date/location

## 2014-07-31 DIAGNOSIS — Z170 Estrogen receptor positive status [ER+]: Secondary | ICD-10-CM | POA: Insufficient documentation

## 2014-07-31 HISTORY — DX: Estrogen receptor positive status (ER+): Z17.0

## 2014-09-27 ENCOUNTER — Ambulatory Visit
Admission: RE | Admit: 2014-09-27 | Discharge: 2014-09-27 | Disposition: A | Discharge status: Home / Self Care | Payer: BC Managed Care – PPO | Source: Ambulatory Visit | Attending: Oncology | Admitting: Oncology

## 2014-09-27 DIAGNOSIS — D0511 Intraductal carcinoma in situ of right breast: Secondary | ICD-10-CM

## 2014-10-09 ENCOUNTER — Other Ambulatory Visit: Payer: Self-pay | Admitting: Oncology

## 2014-11-22 ENCOUNTER — Other Ambulatory Visit: Payer: Self-pay | Admitting: Obstetrics & Gynecology

## 2014-11-23 LAB — CYTOLOGY - PAP

## 2014-12-22 ENCOUNTER — Telehealth: Payer: Self-pay | Admitting: Oncology

## 2014-12-22 NOTE — Telephone Encounter (Signed)
Called and left a message with a new dr Marko Plume appointment due to pal

## 2015-02-10 ENCOUNTER — Other Ambulatory Visit: Payer: BC Managed Care – PPO

## 2015-02-10 ENCOUNTER — Ambulatory Visit: Payer: BC Managed Care – PPO | Admitting: Oncology

## 2015-02-20 ENCOUNTER — Other Ambulatory Visit: Payer: Self-pay | Admitting: Oncology

## 2015-02-20 DIAGNOSIS — D0511 Intraductal carcinoma in situ of right breast: Secondary | ICD-10-CM

## 2015-02-24 ENCOUNTER — Telehealth: Payer: Self-pay | Admitting: Oncology

## 2015-02-24 ENCOUNTER — Ambulatory Visit (HOSPITAL_BASED_OUTPATIENT_CLINIC_OR_DEPARTMENT_OTHER): Payer: BC Managed Care – PPO | Admitting: Oncology

## 2015-02-24 ENCOUNTER — Other Ambulatory Visit (HOSPITAL_BASED_OUTPATIENT_CLINIC_OR_DEPARTMENT_OTHER): Payer: BC Managed Care – PPO

## 2015-02-24 ENCOUNTER — Encounter: Payer: Self-pay | Admitting: Oncology

## 2015-02-24 VITALS — BP 121/60 | HR 86 | Temp 97.9°F | Resp 18 | Ht 65.0 in | Wt 179.2 lb

## 2015-02-24 DIAGNOSIS — D0511 Intraductal carcinoma in situ of right breast: Secondary | ICD-10-CM | POA: Diagnosis not present

## 2015-02-24 DIAGNOSIS — Z90710 Acquired absence of both cervix and uterus: Secondary | ICD-10-CM | POA: Insufficient documentation

## 2015-02-24 DIAGNOSIS — D0510 Intraductal carcinoma in situ of unspecified breast: Secondary | ICD-10-CM | POA: Insufficient documentation

## 2015-02-24 DIAGNOSIS — Z9079 Acquired absence of other genital organ(s): Secondary | ICD-10-CM

## 2015-02-24 DIAGNOSIS — Z79899 Other long term (current) drug therapy: Secondary | ICD-10-CM | POA: Diagnosis not present

## 2015-02-24 DIAGNOSIS — Z90722 Acquired absence of ovaries, bilateral: Secondary | ICD-10-CM

## 2015-02-24 HISTORY — DX: Other long term (current) drug therapy: Z79.899

## 2015-02-24 HISTORY — DX: Acquired absence of both cervix and uterus: Z90.79

## 2015-02-24 HISTORY — DX: Intraductal carcinoma in situ of unspecified breast: D05.10

## 2015-02-24 HISTORY — DX: Acquired absence of both cervix and uterus: Z90.710

## 2015-02-24 LAB — COMPREHENSIVE METABOLIC PANEL (CC13)
ALK PHOS: 71 U/L (ref 40–150)
ALT: 15 U/L (ref 0–55)
AST: 19 U/L (ref 5–34)
Albumin: 3.9 g/dL (ref 3.5–5.0)
Anion Gap: 7 mEq/L (ref 3–11)
BUN: 11.8 mg/dL (ref 7.0–26.0)
CALCIUM: 9.4 mg/dL (ref 8.4–10.4)
CO2: 27 mEq/L (ref 22–29)
CREATININE: 0.8 mg/dL (ref 0.6–1.1)
Chloride: 107 mEq/L (ref 98–109)
EGFR: 77 mL/min/{1.73_m2} — ABNORMAL LOW (ref >90)
Glucose: 114 mg/dl (ref 70–140)
Potassium: 3.7 mEq/L (ref 3.5–5.1)
Sodium: 141 mEq/L (ref 136–145)
Total Bilirubin: <0.3 mg/dL (ref 0.20–1.20)
Total Protein: 6.5 g/dL (ref 6.4–8.3)

## 2015-02-24 LAB — CBC WITH DIFFERENTIAL/PLATELET
BASO%: 0.8 % (ref 0.0–2.0)
Basophils Absolute: 0 10*3/uL (ref 0.0–0.1)
EOS%: 1 % (ref 0.0–7.0)
Eosinophils Absolute: 0.1 10*3/uL (ref 0.0–0.5)
HEMATOCRIT: 37.4 % (ref 34.8–46.6)
HGB: 12.3 g/dL (ref 11.6–15.9)
LYMPH#: 2 10*3/uL (ref 0.9–3.3)
LYMPH%: 32.3 % (ref 14.0–49.7)
MCH: 30.9 pg (ref 25.1–34.0)
MCHC: 32.8 g/dL (ref 31.5–36.0)
MCV: 94 fL (ref 79.5–101.0)
MONO#: 0.4 10*3/uL (ref 0.1–0.9)
MONO%: 6.3 % (ref 0.0–14.0)
NEUT%: 59.6 % (ref 38.4–76.8)
NEUTROS ABS: 3.6 10*3/uL (ref 1.5–6.5)
PLATELETS: 225 10*3/uL (ref 145–400)
RBC: 3.98 10*6/uL (ref 3.70–5.45)
RDW: 13.2 % (ref 11.2–14.5)
WBC: 6.1 10*3/uL (ref 3.9–10.3)

## 2015-02-24 NOTE — Telephone Encounter (Signed)
PER 9/29 POF LAB/LL 6 MOS - MARCH 2017. PATIENT AWARE SHE WILL BE CONTACTED - REMINDERS TO AM/MD

## 2015-02-24 NOTE — Progress Notes (Signed)
OFFICE PROGRESS NOTE   February 24, 2015   Physicians:Toth, Roosvelt Harps, Leonie Green, MD; Thea Silversmith; Dory Horn; GI in [facility]  INTERVAL HISTORY:   Patient is seen, alone for visit, in scheduled follow up of DCIS right breast for which she continues on treatment with tamoxifen. Most recent bilateral tomo mammograms were at [facility] 09-27-14, with no mammographic findings of concern. She sees Dr Marlou Starks next 03-09-15 She sees Dr Moreen Fowler every 6 months for cholesterol, next visit this fall She sees Dr Dory Horn yearly, next in Aug 2017  Patient has felt well since she was here last, with improvement in hot flashes on Effexor by Dr Nori Riis,  begun in August. PAP was negative in Aug, no vaginal bleeding.  She has continued to work out regularly at gym, with improvement in strength and better BP. She is tolerating the tamoxifen without difficulty otherwise. She is not aware of any changes in breasts on self exam.   She will have flu vaccine thru work next week. No genetics testing  She works at [employer].   ONCOLOGIC HISTORY Patient had screening mammograms done at Dr Juan Quam office, with calcifications seen on right. The report of the initial bilateral mammograms is not in this EMR. She had diagnostic right mammogram at [facility] on 10-01-13, which confirmed 7 mm cluster of calcifications right lower outer quadrant. Stereotactic biopsy at [facility] 10-06-2013 [phone]) showed intermediate grade DCIS 66mm, ER 100%, PR 100%. Patient stopped her long term Premarin around the time of that biopsy. MRI bilateral breasts at [place] 10-13-13 showed no enhancement bilateral breasts or lymph nodes, with post biopsy changes on right and incidental hiatal hernia.  She had lumpectomy with radioactive seed localization by Dr Marlou Starks 11-04-2013, with no residual DCIS in the specimen ([id number]).  She saw Dr Pablo Ledger in consultation following the lumpectomy, with RT benefit  felt to be 2-3% in local control for this low risk DCIS (intermediate grade, small, nonpalpable, age >3); after discussion and consideration, patient has elected no radiation therapy. She began tamoxifen 12-05-2013.    Review of systems as above, also: No fever or symptoms of infection. Some increased hair on face and slight thinning of hair in frontal areas bilaterally. Good appetite and energy. No respiratory or GI complaints. No pain. Remainder of 10 point Review of Systems negative.  Objective:  Vital signs in last 24 hours:  BP 121/60 mmHg  Pulse 86  Temp(Src) 97.9 F (36.6 C) (Oral)  Resp 18  Ht 5\' 5"  (1.651 m)  Wt 179 lb 3.2 oz (81.285 kg)  BMI 29.82 kg/m2  SpO2 96% Weight stable Alert, oriented and appropriate. Ambulatory without difficulty.  No obvious alopecia  HEENT:PERRL, sclerae not icteric. Oral mucosa moist without lesions, posterior pharynx clear.  Neck supple. No JVD.  Lymphatics:no cervical,supraclavicular, axillary or inguinal adenopathy Resp: clear to auscultation bilaterally and normal percussion bilaterally Cardio: regular rate and rhythm. No gallop. GI: soft, nontender, not distended, no mass or organomegaly. Normally active bowel sounds. Musculoskeletal/ Extremities: without pitting edema, cords, tenderness Neuro: nonfocal. PSYCH appropriate mood and affect Skin without rash, ecchymosis, petechiae. No obvious hirsutism Breasts: Right breast with well healed lumpectomy scar lower outer quadrant, otherwise bilaterally without dominant mass, skin or nipple findings. Axillae benign.   Lab Results:  Results for orders placed or performed in visit on 02/24/15  CBC with Differential  Result Value Ref Range   WBC 6.1 3.9 - 10.3 10e3/uL   NEUT# 3.6 1.5 -  6.5 10e3/uL   HGB 12.3 11.6 - 15.9 g/dL   HCT 37.4 34.8 - 46.6 %   Platelets 225 145 - 400 10e3/uL   MCV 94.0 79.5 - 101.0 fL   MCH 30.9 25.1 - 34.0 pg   MCHC 32.8 31.5 - 36.0 g/dL   RBC 3.98 3.70 -  5.45 10e6/uL   RDW 13.2 11.2 - 14.5 %   lymph# 2.0 0.9 - 3.3 10e3/uL   MONO# 0.4 0.1 - 0.9 10e3/uL   Eosinophils Absolute 0.1 0.0 - 0.5 10e3/uL   Basophils Absolute 0.0 0.0 - 0.1 10e3/uL   NEUT% 59.6 38.4 - 76.8 %   LYMPH% 32.3 14.0 - 49.7 %   MONO% 6.3 0.0 - 14.0 %   EOS% 1.0 0.0 - 7.0 %   BASO% 0.8 0.0 - 2.0 %    CMET available after visit entirely normal, including glucose 114 and creatinine 0.8  Studies/Results:  EXAM: DIGITAL DIAGNOSTIC BILATERAL MAMMOGRAM WITH 3D TOMOSYNTHESIS AND CAD  COMPARISON: Previous examinations.  ACR Breast Density Category b: There are scattered areas of fibroglandular density.  FINDINGS: Post lumpectomy changes in the upper-outer quadrant of the right breast with improvement since 12/01/2013. No intervals findings suspicious for malignancy in either breast.  Mammographic images were processed with CAD.  IMPRESSION: No evidence of malignancy.  RECOMMENDATION: Bilateral diagnostic mammogram in 1 year.  Medications: I have reviewed the patient's current medications. Continue tamoxifen. Flu vaccine elsewhere upcoming.  DISCUSSION: clinically doing well, continue tamoxifen planned x 5 years in preventative attempt. Encouraged to continue good exercise program.   Assessment/Plan:  1. ER PR positive intermediate grade DCIS: 63mm disease removed with core needle biopsy, with excellent margins by virtue of the lumpectomy specimen. No radiation. On tamoxifen in preventative attempt since ~ 12-05-2013. She will keep appointment with Dr Marlou Starks in Oct and I will see her back in 6 months. Mammograms next in 09-2015 2. Long term use of premarin, DCd with this diagnosis  3.BMI 29. Good results from exercise program. 4.post Hysterectomy and BSO 1998 for benign indications  5.elevated lipids followed regularly by Dr Moreen Fowler 6.HTN much improved, BP 121/60 today, likely with the exercise. 7.hx GERD 8.flu vaccine planned  All questions answered and  patient is in agreement with recommendations and plans. TIme spent 20 min including >50% counseling and coordination of care. CC Drs Domingo Cocking, Janine Limbo, MD   02/24/2015, 2:52 PM

## 2015-03-17 IMAGING — MG MM RT BREAST BX W LOC DEV 1ST LESION IMAGE BX SPEC STEREO GUIDE
3 series · 3 of 3 positions shown · non-contrast
Comparison: Previous exams.

ADDENDUM:
The pathology associated with the right breast stereotactic core
biopsy demonstrated DCIS. This is concordant with the imaging
findings. I have discussed findings with the patient by telephone
and answered her questions. The patient states her biopsy site is
clean and dry without hematoma formation or signs of infection. Post
biopsy wound care instructions were reviewed with the patient. The
patient is scheduled for breast MRI on 10/13/2013. Patient is
scheduled for the breast cancer multi disciplinary clinic on
10/14/2013. The patient was encouraged to call the [REDACTED] for
additional questions or concerns.
CLINICAL DATA: Right breast calcifications.

EXAM:
Right BREAST STEREOTACTIC CORE NEEDLE BIOPSY

[R SPECIMEN]
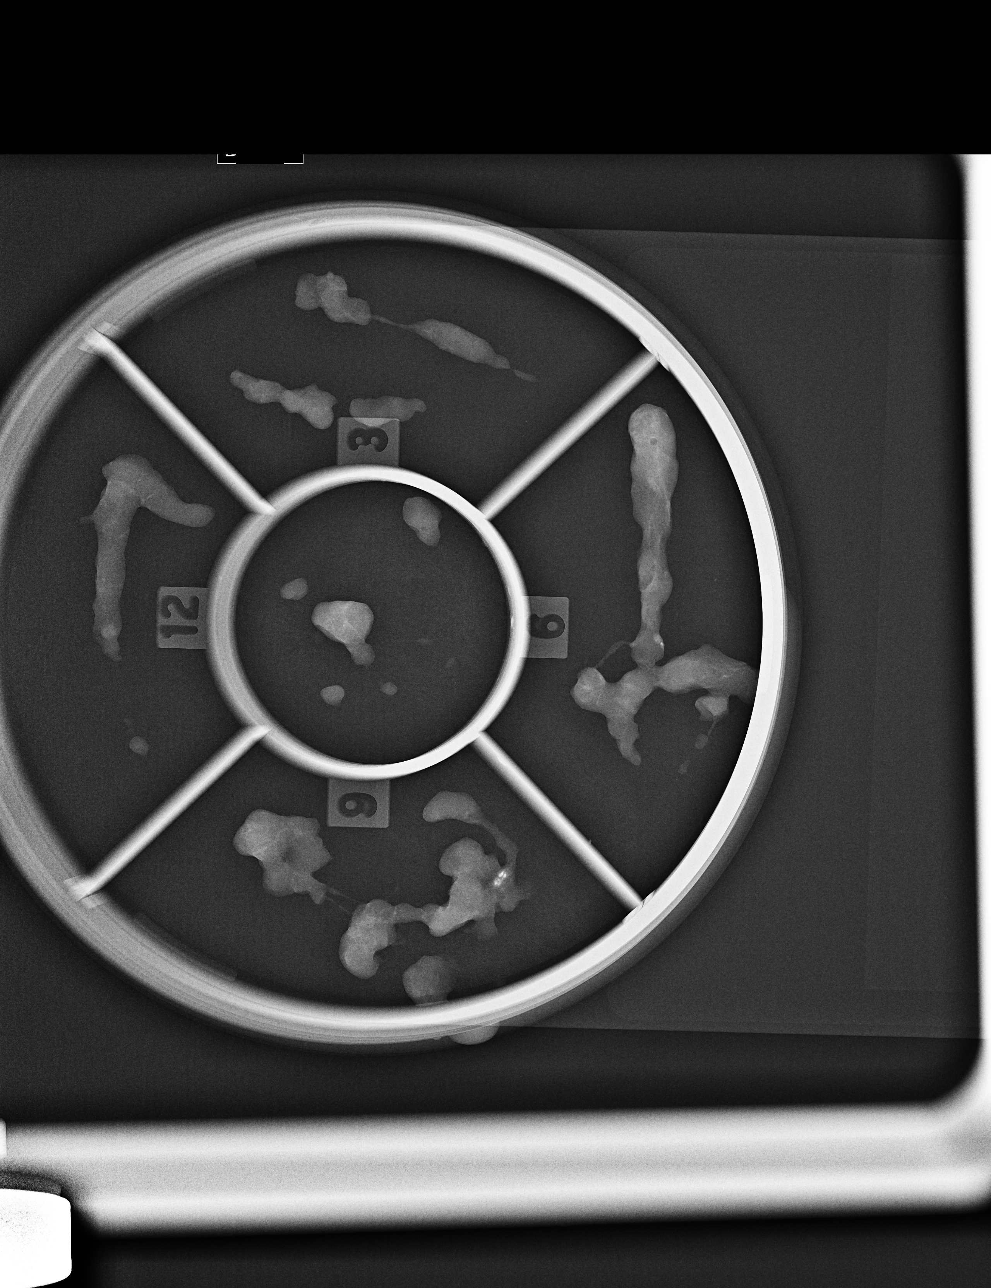

[R CC]
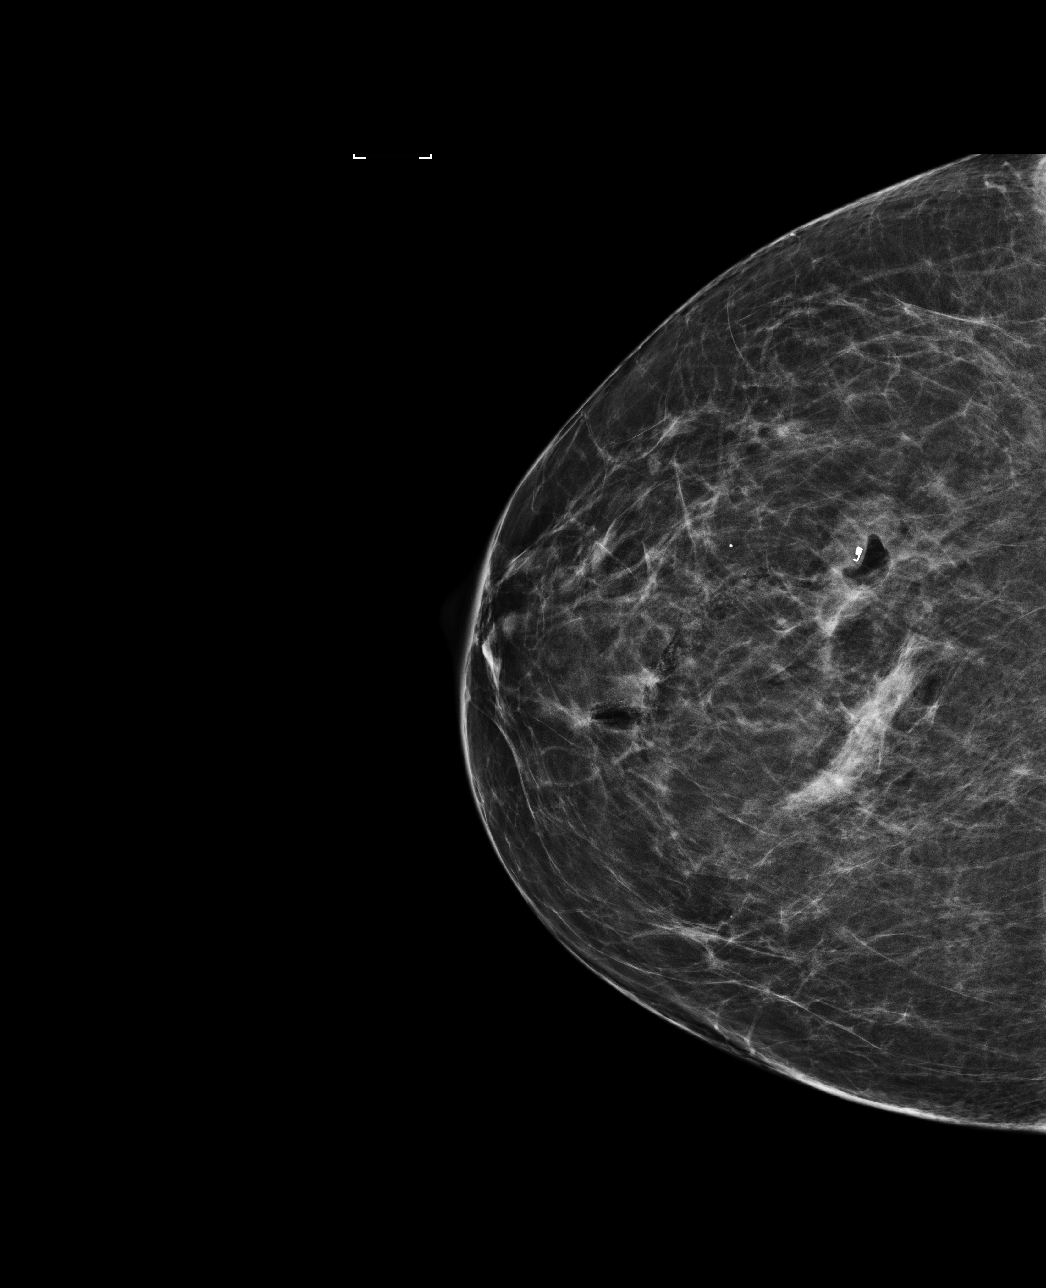

[R ML]
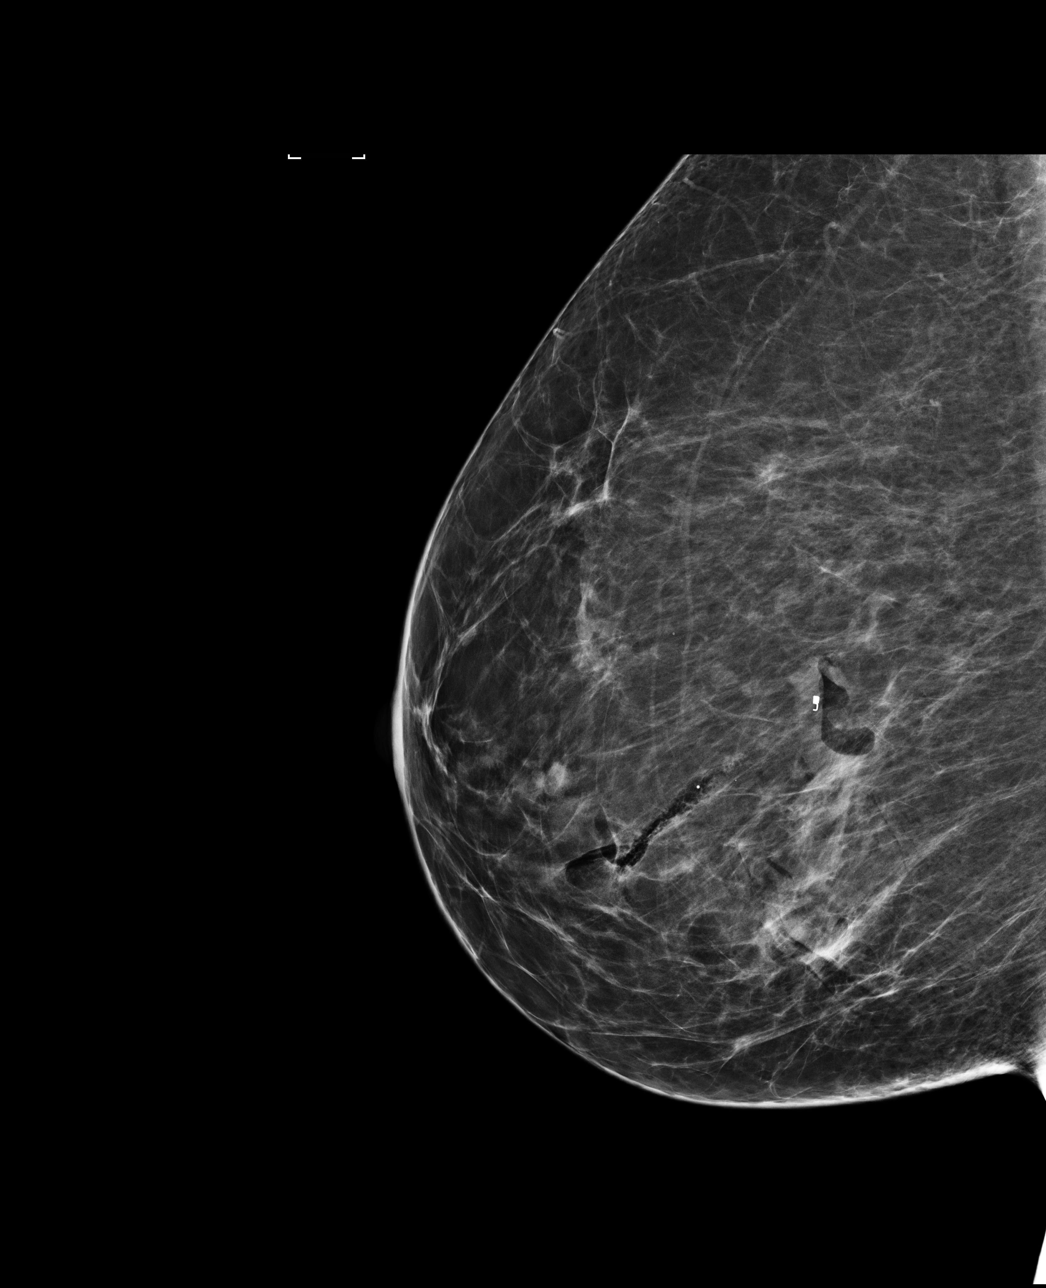

[3 of 3 positions shown; findings below may reference images not displayed]



Using sterile technique and 2% lidocaine as local anesthetic, under
stereotactic guidance, a 9 gauge vacuum assisted core biopsy device
was used to perform core needle biopsy of calcifications within the
lower outer quadrant of the right breast using an inferior
craniocaudal approach approach. Specimen radiograph was performed
showing representative calcifications within the specimen. Specimens
with calcifications are identified for pathology.

At the conclusion of the procedure, a cylinder-shaped tissue marker
clip was deployed into the biopsy cavity. Follow-up 2-view mammogram
confirmed clip to be in appropriate position.
IMPRESSION: Stereotactic-guided biopsy of right breast calcifications as
discussed above. No apparent complications.

## 2015-03-28 ENCOUNTER — Other Ambulatory Visit: Payer: Self-pay | Admitting: Oncology

## 2015-03-28 DIAGNOSIS — D0510 Intraductal carcinoma in situ of unspecified breast: Secondary | ICD-10-CM

## 2015-06-16 ENCOUNTER — Telehealth: Payer: Self-pay | Admitting: Oncology

## 2015-06-16 NOTE — Telephone Encounter (Signed)
Spoke with patient and she is aware of her 30month follow up    Rachel Edwards

## 2015-08-12 ENCOUNTER — Other Ambulatory Visit: Payer: Self-pay | Admitting: Oncology

## 2015-08-19 ENCOUNTER — Other Ambulatory Visit: Payer: Self-pay | Admitting: *Deleted

## 2015-08-19 DIAGNOSIS — C50511 Malignant neoplasm of lower-outer quadrant of right female breast: Secondary | ICD-10-CM

## 2015-08-22 ENCOUNTER — Other Ambulatory Visit (HOSPITAL_BASED_OUTPATIENT_CLINIC_OR_DEPARTMENT_OTHER): Payer: BC Managed Care – PPO

## 2015-08-22 ENCOUNTER — Ambulatory Visit (HOSPITAL_BASED_OUTPATIENT_CLINIC_OR_DEPARTMENT_OTHER): Payer: BC Managed Care – PPO | Admitting: Oncology

## 2015-08-22 ENCOUNTER — Encounter: Payer: Self-pay | Admitting: Oncology

## 2015-08-22 ENCOUNTER — Telehealth: Payer: Self-pay | Admitting: Oncology

## 2015-08-22 VITALS — BP 129/78 | HR 90 | Temp 98.4°F | Resp 18 | Ht 65.0 in | Wt 186.2 lb

## 2015-08-22 DIAGNOSIS — D0511 Intraductal carcinoma in situ of right breast: Secondary | ICD-10-CM

## 2015-08-22 DIAGNOSIS — C50511 Malignant neoplasm of lower-outer quadrant of right female breast: Secondary | ICD-10-CM

## 2015-08-22 LAB — CBC WITH DIFFERENTIAL/PLATELET
BASO%: 0.5 % (ref 0.0–2.0)
Basophils Absolute: 0 10*3/uL (ref 0.0–0.1)
EOS%: 1.1 % (ref 0.0–7.0)
Eosinophils Absolute: 0.1 10*3/uL (ref 0.0–0.5)
HCT: 36.4 % (ref 34.8–46.6)
HGB: 12 g/dL (ref 11.6–15.9)
LYMPH%: 35.1 % (ref 14.0–49.7)
MCH: 31.2 pg (ref 25.1–34.0)
MCHC: 33.1 g/dL (ref 31.5–36.0)
MCV: 94.2 fL (ref 79.5–101.0)
MONO#: 0.4 10*3/uL (ref 0.1–0.9)
MONO%: 6.7 % (ref 0.0–14.0)
NEUT%: 56.6 % (ref 38.4–76.8)
NEUTROS ABS: 3.5 10*3/uL (ref 1.5–6.5)
Platelets: 201 10*3/uL (ref 145–400)
RBC: 3.86 10*6/uL (ref 3.70–5.45)
RDW: 13 % (ref 11.2–14.5)
WBC: 6.1 10*3/uL (ref 3.9–10.3)
lymph#: 2.2 10*3/uL (ref 0.9–3.3)

## 2015-08-22 LAB — COMPREHENSIVE METABOLIC PANEL
ALT: 26 U/L (ref 0–55)
AST: 30 U/L (ref 5–34)
Albumin: 3.9 g/dL (ref 3.5–5.0)
Alkaline Phosphatase: 70 U/L (ref 40–150)
Anion Gap: 8 mEq/L (ref 3–11)
BUN: 14.8 mg/dL (ref 7.0–26.0)
CO2: 26 meq/L (ref 22–29)
CREATININE: 0.9 mg/dL (ref 0.6–1.1)
Calcium: 9.2 mg/dL (ref 8.4–10.4)
Chloride: 106 mEq/L (ref 98–109)
EGFR: 69 mL/min/{1.73_m2} — ABNORMAL LOW (ref >90)
GLUCOSE: 115 mg/dL (ref 70–140)
Potassium: 3.9 mEq/L (ref 3.5–5.1)
Sodium: 141 mEq/L (ref 136–145)
Total Bilirubin: <0.3 mg/dL (ref 0.20–1.20)
Total Protein: 6.7 g/dL (ref 6.4–8.3)

## 2015-08-22 MED ORDER — TAMOXIFEN CITRATE 20 MG PO TABS: 20.0000 mg | ORAL_TABLET | Freq: Every day | ORAL | Status: DC

## 2015-08-22 NOTE — Progress Notes (Signed)
OFFICE PROGRESS NOTE   August 22, 2015   Physicians:Toth, Roosvelt Harps, Leonie Green, MD; Thea Silversmith; Dory Horn; GI in [facility]  INTERVAL HISTORY:   Patient is seen, alone for visit, in scheduled follow up of DCIS right breast for which she continues on treatment with tamoxifen. Most recent bilateral tomo mammograms were at [facility] 09-27-14, with no mammographic findings of concern. She sees Dr Marlou Starks in about April 2017 She sees Dr Moreen Fowler every 6 months for cholesterol She sees Dr Dory Horn yearly, next in Aug 2017  Patient has felt well since she was here last. Hot flashes controlled with Effexor by Dr Nori Riis,  begun in August 2016. PAP was negative in Aug, no vaginal bleeding.  She has continued to work out regularly at gym, with improvement in strength and better BP. She is tolerating the tamoxifen without difficulty otherwise. She is not aware of any changes in breasts on self exam.   Flu vaccine 01-2015. No genetics testing  She works at [employer]. Retiring 11-26-2015.  ONCOLOGIC HISTORY Patient had screening mammograms done at Dr Juan Quam office, with calcifications seen on right. The report of the initial bilateral mammograms is not in this EMR. She had diagnostic right mammogram at [facility] on 10-01-13, which confirmed 7 mm cluster of calcifications right lower outer quadrant. Stereotactic biopsy at [facility] 10-06-2013 [phone]) showed intermediate grade DCIS 50m, ER 100%, PR 100%. Patient stopped her long term Premarin around the time of that biopsy. [place] showed no enhancement bilateral breasts or lymph nodes, with post biopsy changes on right and incidental hiatal hernia.  She had lumpectomy with radioactive seed localization by Dr TMarlou Starks6-02-2014, with no residual DCIS in the specimen (([id number].  She saw Dr WPablo Ledgerin consultation following the lumpectomy, with RT benefit felt to be 2-3% in local control  for this low risk DCIS (intermediate grade, small, nonpalpable, age >>62; after discussion and consideration, patient has elected no radiation therapy. She began tamoxifen 12-05-2013.    Review of systems as above, also: No fever or symptoms of infection. Good appetite and energy. No respiratory or GI complaints. No pain. Remainder of 10 point Review of Systems negative.  Objective:  Vital signs in last 24 hours:  BP 129/78 mmHg  Pulse 90  Temp(Src) 98.4 F (36.9 C) (Oral)  Resp 18  Ht 5' 5"  (1.651 m)  Wt 186 lb 3.2 oz (84.46 kg)  BMI 30.99 kg/m2  SpO2 99%  Alert, oriented and appropriate. Ambulatory without difficulty.  No obvious alopecia  HEENT:PERRL, sclerae not icteric. Oral mucosa moist without lesions, posterior pharynx clear.  Neck supple. No JVD.  Lymphatics:no cervical,supraclavicular, axillary or inguinal adenopathy Resp: clear to auscultation bilaterally and normal percussion bilaterally Cardio: regular rate and rhythm. No gallop. GI: soft, nontender, not distended, no mass or organomegaly. Normally active bowel sounds. Musculoskeletal/ Extremities: without pitting edema, cords, tenderness Neuro: nonfocal. PSYCH appropriate mood and affect Skin without rash, ecchymosis, petechiae. No obvious hirsutism Breasts: Right breast with well healed lumpectomy scar lower outer quadrant, otherwise bilaterally without dominant mass, skin or nipple findings. Axillae benign.   Lab Results:  Results for orders placed or performed in visit on 08/22/15  CBC with Differential  Result Value Ref Range   WBC 6.1 3.9 - 10.3 10e3/uL   NEUT# 3.5 1.5 - 6.5 10e3/uL   HGB 12.0 11.6 - 15.9 g/dL   HCT 36.4 34.8 - 46.6 %   Platelets 201  145 - 400 10e3/uL   MCV 94.2 79.5 - 101.0 fL   MCH 31.2 25.1 - 34.0 pg   MCHC 33.1 31.5 - 36.0 g/dL   RBC 3.86 3.70 - 5.45 10e6/uL   RDW 13.0 11.2 - 14.5 %   lymph# 2.2 0.9 - 3.3 10e3/uL   MONO# 0.4 0.1 - 0.9 10e3/uL   Eosinophils Absolute 0.1 0.0 -  0.5 10e3/uL   Basophils Absolute 0.0 0.0 - 0.1 10e3/uL   NEUT% 56.6 38.4 - 76.8 %   LYMPH% 35.1 14.0 - 49.7 %   MONO% 6.7 0.0 - 14.0 %   EOS% 1.1 0.0 - 7.0 %   BASO% 0.5 0.0 - 2.0 %  Comprehensive metabolic panel  Result Value Ref Range   Sodium 141 136 - 145 mEq/L   Potassium 3.9 3.5 - 5.1 mEq/L   Chloride 106 98 - 109 mEq/L   CO2 26 22 - 29 mEq/L   Glucose 115 70 - 140 mg/dl   BUN 14.8 7.0 - 26.0 mg/dL   Creatinine 0.9 0.6 - 1.1 mg/dL   Total Bilirubin <0.30 0.20 - 1.20 mg/dL   Alkaline Phosphatase 70 40 - 150 U/L   AST 30 5 - 34 U/L   ALT 26 0 - 55 U/L   Total Protein 6.7 6.4 - 8.3 g/dL   Albumin 3.9 3.5 - 5.0 g/dL   Calcium 9.2 8.4 - 10.4 mg/dL   Anion Gap 8 3 - 11 mEq/L   EGFR 69 (L) >90 ml/min/1.73 m2    CMET available after visit entirely normal, including glucose 114 and creatinine 0.8  Studies/Results:  EXAM: DIGITAL DIAGNOSTIC BILATERAL MAMMOGRAM WITH 3D TOMOSYNTHESIS AND CAD  COMPARISON: Previous examinations.  ACR Breast Density Category b: There are scattered areas of fibroglandular density.  FINDINGS: Post lumpectomy changes in the upper-outer quadrant of the right breast with improvement since 12/01/2013. No intervals findings suspicious for malignancy in either breast.  Mammographic images were processed with CAD.  IMPRESSION: No evidence of malignancy.  RECOMMENDATION: Bilateral diagnostic mammogram in 1 year.  Medications: I have reviewed the patient's current medications. Continue tamoxifen. Refill sent to local pharmacy.   DISCUSSION: clinically doing well, continue tamoxifen planned x 5 years in preventative attempt. Encouraged to continue good exercise program. Mammogram ordered for 09-2015.  Assessment/Plan:  1. ER PR positive intermediate grade DCIS: 11m disease removed with core needle biopsy, with excellent margins by virtue of the lumpectomy specimen. No radiation. On tamoxifen in preventative attempt since ~ 12-05-2013. She  will keep appointment with Dr TMarlou Starksand we will see her back in 6 months. Mammograms next in 09-2015 2. Long term use of premarin, DCd with this diagnosis  3.BMI 29. Good results from exercise program. 4.post Hysterectomy and BSO 1998 for benign indications  5.elevated lipids followed regularly by Dr SMoreen Fowler6.HTN much improved, BP 121/60 today, likely with the exercise. 7.hx GERD 8.flu vaccine complete 01-2016.  All questions answered and patient is in agreement with recommendations and plans.   TIme spent 20 min including >50% counseling and coordination of care. CC Drs TDomingo Cocking NCollene Leyden NP   08/22/2015, 3:49 PM

## 2015-08-22 NOTE — Telephone Encounter (Signed)
per pof to sch pt appt-gave pt copyof avs-pt stated will make own mamma appt

## 2015-10-04 ENCOUNTER — Other Ambulatory Visit: Payer: Self-pay | Admitting: Oncology

## 2015-10-04 ENCOUNTER — Ambulatory Visit
Admission: RE | Admit: 2015-10-04 | Discharge: 2015-10-04 | Disposition: A | Discharge status: Home / Self Care | Payer: BC Managed Care – PPO | Source: Ambulatory Visit | Attending: Oncology | Admitting: Oncology

## 2015-10-04 ENCOUNTER — Telehealth: Payer: Self-pay | Admitting: *Deleted

## 2015-10-04 DIAGNOSIS — D0511 Intraductal carcinoma in situ of right breast: Secondary | ICD-10-CM

## 2015-10-04 NOTE — Telephone Encounter (Signed)
Call from [place] requesting order for US breast be signed by Curcio.  Radiologist has entered request and routed to her.  Patient on table today at this time.  Verbal order received and read back from Dr. Jana Hakim to proceed with [place] of Breast (on-call today).  Will co-sign electronic order.  Ena Dawley will fax order to [phone] as electronic order was sent to [facility] and exam in progress.

## 2015-10-05 ENCOUNTER — Other Ambulatory Visit: Payer: Self-pay | Admitting: Oncology

## 2016-02-17 ENCOUNTER — Telehealth: Payer: Self-pay | Admitting: Oncology

## 2016-02-17 NOTE — Telephone Encounter (Signed)
PAL - MOVED FROM 9/25 TO 10/16 - LEFT MESSAGE FOR PATIENT

## 2016-02-20 ENCOUNTER — Other Ambulatory Visit: Payer: BC Managed Care – PPO

## 2016-02-20 ENCOUNTER — Ambulatory Visit: Payer: BC Managed Care – PPO | Admitting: Oncology

## 2016-03-10 ENCOUNTER — Other Ambulatory Visit: Payer: Self-pay | Admitting: Oncology

## 2016-03-10 DIAGNOSIS — D0511 Intraductal carcinoma in situ of right breast: Secondary | ICD-10-CM

## 2016-03-11 ENCOUNTER — Other Ambulatory Visit: Payer: Self-pay | Admitting: Oncology

## 2016-03-12 ENCOUNTER — Telehealth: Payer: Self-pay | Admitting: Oncology

## 2016-03-12 ENCOUNTER — Ambulatory Visit: Payer: BC Managed Care – PPO | Admitting: Oncology

## 2016-03-12 ENCOUNTER — Other Ambulatory Visit: Payer: BC Managed Care – PPO

## 2016-03-12 NOTE — Telephone Encounter (Signed)
10/16 Appointment canceled per patient request. The patient stated that she locked her keys in her car and would not be able to make it to her appointment.

## 2016-03-19 ENCOUNTER — Other Ambulatory Visit: Payer: Self-pay | Admitting: *Deleted

## 2016-03-19 MED ORDER — TAMOXIFEN CITRATE 20 MG PO TABS: 20.0000 mg | ORAL_TABLET | Freq: Every day | ORAL | 1 refills | Status: DC

## 2016-08-06 ENCOUNTER — Encounter (HOSPITAL_COMMUNITY): Payer: Self-pay | Admitting: Emergency Medicine

## 2016-08-06 ENCOUNTER — Emergency Department (HOSPITAL_COMMUNITY): Payer: Medicare Other

## 2016-08-06 ENCOUNTER — Observation Stay (HOSPITAL_COMMUNITY)
Admission: EM | Admit: 2016-08-06 | Discharge: 2016-08-08 | Disposition: A | Discharge status: Home / Self Care | Payer: Medicare Other | Attending: Internal Medicine | Admitting: Internal Medicine

## 2016-08-06 DIAGNOSIS — R079 Chest pain, unspecified: Secondary | ICD-10-CM | POA: Diagnosis not present

## 2016-08-06 DIAGNOSIS — K449 Diaphragmatic hernia without obstruction or gangrene: Secondary | ICD-10-CM | POA: Diagnosis not present

## 2016-08-06 DIAGNOSIS — F418 Other specified anxiety disorders: Secondary | ICD-10-CM | POA: Diagnosis not present

## 2016-08-06 DIAGNOSIS — Z6830 Body mass index (BMI) 30.0-30.9, adult: Secondary | ICD-10-CM | POA: Diagnosis not present

## 2016-08-06 DIAGNOSIS — D500 Iron deficiency anemia secondary to blood loss (chronic): Secondary | ICD-10-CM | POA: Insufficient documentation

## 2016-08-06 DIAGNOSIS — K2980 Duodenitis without bleeding: Secondary | ICD-10-CM | POA: Diagnosis not present

## 2016-08-06 DIAGNOSIS — Z79899 Other long term (current) drug therapy: Secondary | ICD-10-CM | POA: Diagnosis not present

## 2016-08-06 DIAGNOSIS — Z7982 Long term (current) use of aspirin: Secondary | ICD-10-CM | POA: Diagnosis not present

## 2016-08-06 DIAGNOSIS — E669 Obesity, unspecified: Secondary | ICD-10-CM | POA: Diagnosis present

## 2016-08-06 DIAGNOSIS — I10 Essential (primary) hypertension: Secondary | ICD-10-CM | POA: Insufficient documentation

## 2016-08-06 DIAGNOSIS — D0511 Intraductal carcinoma in situ of right breast: Secondary | ICD-10-CM | POA: Diagnosis not present

## 2016-08-06 DIAGNOSIS — R195 Other fecal abnormalities: Secondary | ICD-10-CM | POA: Diagnosis present

## 2016-08-06 DIAGNOSIS — I77819 Aortic ectasia, unspecified site: Secondary | ICD-10-CM | POA: Insufficient documentation

## 2016-08-06 DIAGNOSIS — R0789 Other chest pain: Secondary | ICD-10-CM | POA: Diagnosis not present

## 2016-08-06 DIAGNOSIS — I5189 Other ill-defined heart diseases: Secondary | ICD-10-CM

## 2016-08-06 DIAGNOSIS — D649 Anemia, unspecified: Secondary | ICD-10-CM

## 2016-08-06 DIAGNOSIS — Z853 Personal history of malignant neoplasm of breast: Secondary | ICD-10-CM | POA: Insufficient documentation

## 2016-08-06 DIAGNOSIS — F4321 Adjustment disorder with depressed mood: Secondary | ICD-10-CM

## 2016-08-06 DIAGNOSIS — K222 Esophageal obstruction: Secondary | ICD-10-CM | POA: Diagnosis not present

## 2016-08-06 DIAGNOSIS — Z79810 Long term (current) use of selective estrogen receptor modulators (SERMs): Secondary | ICD-10-CM | POA: Insufficient documentation

## 2016-08-06 DIAGNOSIS — K2950 Unspecified chronic gastritis without bleeding: Secondary | ICD-10-CM | POA: Insufficient documentation

## 2016-08-06 HISTORY — DX: Adjustment disorder with depressed mood: F43.21

## 2016-08-06 HISTORY — DX: Chest pain, unspecified: R07.9

## 2016-08-06 HISTORY — DX: Anemia, unspecified: D64.9

## 2016-08-06 HISTORY — DX: Other fecal abnormalities: R19.5

## 2016-08-06 HISTORY — DX: Diaphragmatic hernia without obstruction or gangrene: K44.9

## 2016-08-06 LAB — CBC
HCT: 32.2 % — ABNORMAL LOW (ref 36.0–46.0)
HEMATOCRIT: 27.8 % — AB (ref 36.0–46.0)
HEMOGLOBIN: 9.2 g/dL — AB (ref 12.0–15.0)
Hemoglobin: 10.5 g/dL — ABNORMAL LOW (ref 12.0–15.0)
MCH: 31.1 pg (ref 26.0–34.0)
MCH: 29.3 pg (ref 26.0–34.0)
MCHC: 33.1 g/dL (ref 30.0–36.0)
MCHC: 32.6 g/dL (ref 30.0–36.0)
MCV: 93.9 fL (ref 78.0–100.0)
MCV: 89.9 fL (ref 78.0–100.0)
PLATELETS: 182 10*3/uL (ref 150–400)
Platelets: 246 10*3/uL (ref 150–400)
RBC: 2.96 MIL/uL — ABNORMAL LOW (ref 3.87–5.11)
RBC: 3.58 MIL/uL — ABNORMAL LOW (ref 3.87–5.11)
RDW: 12.9 % (ref 11.5–15.5)
RDW: 15.1 % (ref 11.5–15.5)
WBC: 10.7 10*3/uL — AB (ref 4.0–10.5)
WBC: 6.2 10*3/uL (ref 4.0–10.5)

## 2016-08-06 LAB — MAGNESIUM: MAGNESIUM: 2 mg/dL (ref 1.7–2.4)

## 2016-08-06 LAB — BASIC METABOLIC PANEL
ANION GAP: 15 (ref 5–15)
BUN: 25 mg/dL — ABNORMAL HIGH (ref 6–20)
CHLORIDE: 104 mmol/L (ref 101–111)
CO2: 20 mmol/L — ABNORMAL LOW (ref 22–32)
Calcium: 9.1 mg/dL (ref 8.9–10.3)
Creatinine, Ser: 0.97 mg/dL (ref 0.44–1.00)
GFR calc Af Amer: >60 mL/min (ref >60)
GFR, EST NON AFRICAN AMERICAN: 60 mL/min — AB (ref >60)
Glucose, Bld: 122 mg/dL — ABNORMAL HIGH (ref 65–99)
Potassium: 3.8 mmol/L (ref 3.5–5.1)
Sodium: 139 mmol/L (ref 135–145)

## 2016-08-06 LAB — T4, FREE: FREE T4: 0.7 ng/dL (ref 0.61–1.12)

## 2016-08-06 LAB — ABO/RH: ABO/RH(D): O POS

## 2016-08-06 LAB — FOLATE: Folate: 29.7 ng/mL (ref >5.9)

## 2016-08-06 LAB — I-STAT TROPONIN, ED: Troponin i, poc: 0 ng/mL (ref 0.00–0.08)

## 2016-08-06 LAB — IRON AND TIBC
Iron: 43 ug/dL (ref 28–170)
Saturation Ratios: 13 % (ref 10.4–31.8)
TIBC: 325 ug/dL (ref 250–450)
UIBC: 282 ug/dL

## 2016-08-06 LAB — RETICULOCYTES
RBC.: 2.55 MIL/uL — AB (ref 3.87–5.11)
RETIC COUNT ABSOLUTE: 51 10*3/uL (ref 19.0–186.0)
RETIC CT PCT: 2 % (ref 0.4–3.1)

## 2016-08-06 LAB — TSH: TSH: 4.943 u[IU]/mL — ABNORMAL HIGH (ref 0.350–4.500)

## 2016-08-06 LAB — TROPONIN I: Troponin I: <0.03 ng/mL (ref <0.03)

## 2016-08-06 LAB — POC OCCULT BLOOD, ED: FECAL OCCULT BLD: POSITIVE — AB

## 2016-08-06 LAB — PREPARE RBC (CROSSMATCH)

## 2016-08-06 LAB — VITAMIN B12: Vitamin B-12: 352 pg/mL (ref 180–914)

## 2016-08-06 LAB — FERRITIN: FERRITIN: 34 ng/mL (ref 11–307)

## 2016-08-06 MED ORDER — TAMOXIFEN CITRATE 10 MG PO TABS
20.0000 mg | ORAL_TABLET | Freq: Every day | ORAL | Status: DC
  Administered 2016-08-06 – 2016-08-08 (×3): 20 mg via ORAL
  Filled 2016-08-06 (×3): qty 2

## 2016-08-06 MED ORDER — ACETAMINOPHEN 650 MG RE SUPP: 650.0000 mg | Freq: Four times a day (QID) | RECTAL | Status: DC | PRN

## 2016-08-06 MED ORDER — SODIUM CHLORIDE 0.9 % IV SOLN
Freq: Once | INTRAVENOUS | Status: AC
  Administered 2016-08-06: 14:00:00 via INTRAVENOUS

## 2016-08-06 MED ORDER — FAMOTIDINE 20 MG PO TABS: 20.0000 mg | ORAL_TABLET | Freq: Two times a day (BID) | ORAL | Status: DC

## 2016-08-06 MED ORDER — ADULT MULTIVITAMIN W/MINERALS CH
1.0000 | ORAL_TABLET | Freq: Every day | ORAL | Status: DC
  Administered 2016-08-06 – 2016-08-08 (×3): 1 via ORAL
  Filled 2016-08-06 (×3): qty 1

## 2016-08-06 MED ORDER — VENLAFAXINE HCL 37.5 MG PO TABS
37.5000 mg | ORAL_TABLET | Freq: Every day | ORAL | Status: DC
  Administered 2016-08-06 – 2016-08-08 (×3): 37.5 mg via ORAL
  Filled 2016-08-06 (×4): qty 1

## 2016-08-06 MED ORDER — SODIUM CHLORIDE 0.9 % IV BOLUS (SEPSIS)
1000.0000 mL | Freq: Once | INTRAVENOUS | Status: AC
  Administered 2016-08-06: 1000 mL via INTRAVENOUS

## 2016-08-06 MED ORDER — ONDANSETRON HCL 4 MG PO TABS: 4.0000 mg | ORAL_TABLET | Freq: Four times a day (QID) | ORAL | Status: DC | PRN

## 2016-08-06 MED ORDER — FAMOTIDINE IN NACL 20-0.9 MG/50ML-% IV SOLN
20.0000 mg | Freq: Two times a day (BID) | INTRAVENOUS | Status: DC
  Administered 2016-08-06 – 2016-08-08 (×6): 20 mg via INTRAVENOUS
  Filled 2016-08-06 (×5): qty 50

## 2016-08-06 MED ORDER — MIRABEGRON ER 25 MG PO TB24
50.0000 mg | ORAL_TABLET | Freq: Every day | ORAL | Status: DC
  Administered 2016-08-06 – 2016-08-08 (×3): 50 mg via ORAL
  Filled 2016-08-06 (×3): qty 1
  Filled 2016-08-06: qty 2

## 2016-08-06 MED ORDER — ONDANSETRON HCL 4 MG/2ML IJ SOLN: 4.0000 mg | Freq: Four times a day (QID) | INTRAMUSCULAR | Status: DC | PRN

## 2016-08-06 MED ORDER — DIAZEPAM 5 MG PO TABS: 10.0000 mg | ORAL_TABLET | Freq: Four times a day (QID) | ORAL | Status: DC | PRN

## 2016-08-06 MED ORDER — BUSPIRONE HCL 5 MG PO TABS
15.0000 mg | ORAL_TABLET | Freq: Two times a day (BID) | ORAL | Status: DC
  Administered 2016-08-06 – 2016-08-07 (×4): 15 mg via ORAL
  Filled 2016-08-06 (×2): qty 2
  Filled 2016-08-06: qty 1
  Filled 2016-08-06 (×2): qty 2

## 2016-08-06 MED ORDER — ROSUVASTATIN CALCIUM 10 MG PO TABS: 10.0000 mg | ORAL_TABLET | Freq: Every day | ORAL | Status: DC

## 2016-08-06 MED ORDER — PANTOPRAZOLE SODIUM 40 MG IV SOLR
40.0000 mg | Freq: Two times a day (BID) | INTRAVENOUS | Status: DC
  Administered 2016-08-06 – 2016-08-07 (×4): 40 mg via INTRAVENOUS
  Filled 2016-08-06 (×4): qty 40

## 2016-08-06 MED ORDER — IOPAMIDOL (ISOVUE-300) INJECTION 61%
INTRAVENOUS | Status: AC
  Administered 2016-08-06: 100 mL via INTRAVENOUS
  Filled 2016-08-06: qty 100

## 2016-08-06 MED ORDER — SODIUM CHLORIDE 0.9 % IV SOLN
INTRAVENOUS | Status: DC
  Administered 2016-08-06 – 2016-08-08 (×3): via INTRAVENOUS

## 2016-08-06 MED ORDER — ACETAMINOPHEN 325 MG PO TABS
650.0000 mg | ORAL_TABLET | Freq: Four times a day (QID) | ORAL | Status: DC | PRN
  Administered 2016-08-07: 650 mg via ORAL
  Filled 2016-08-06: qty 2

## 2016-08-06 MED ORDER — BUPROPION HCL ER (XL) 150 MG PO TB24
300.0000 mg | ORAL_TABLET | Freq: Every day | ORAL | Status: DC
  Administered 2016-08-06 – 2016-08-08 (×3): 300 mg via ORAL
  Filled 2016-08-06 (×3): qty 2

## 2016-08-06 MED ORDER — SODIUM CHLORIDE 0.9% FLUSH
3.0000 mL | Freq: Two times a day (BID) | INTRAVENOUS | Status: DC
Start: 2016-08-06 — End: 2016-08-08
  Administered 2016-08-06 – 2016-08-07 (×3): 3 mL via INTRAVENOUS

## 2016-08-06 NOTE — Consult Note (Signed)
Referring Provider:  [facility] Primary Care Physician:  Gara Kroner, MD Primary Gastroenterologist:  Althia Forts  Reason for Consultation:  Anemia, occult blood positive stool  HPI: Rachel Edwards is a 66 y.o. female admitted to the hospital for evaluation of chest tightness and tachycardia. Patient was incidentally found to have anemia and occult blood positive stool. GI is consulted for further evaluation. 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  Patient seen and examined. Patient denied any overt GI bleed. Denied bright red blood per rectum or black stool. Complaining of occasional acid reflux. Denied any dysphagia to me. Denied odynophagia. Denied abdominal pain. Denied nausea or vomiting. Takes occasional ibuprofen for pain.  Last  colonoscopy 10 years ago at [facility] and was found to have poor prep. Repeat was recommended but she did not follow. No Family history of colon cancer.  Past Medical History:  Diagnosis Date  . Anemia 08/06/2016  . Depression   . Hiatal hernia   . Hyperlipidemia   . Hypertension   . Panic attack   . Uterine fibroid   . Wears glasses     Past Surgical History:  Procedure Laterality Date  . ABDOMINAL HYSTERECTOMY  1998  . BREAST LUMPECTOMY WITH RADIOACTIVE SEED LOCALIZATION Right 11/04/2013   Procedure: RIGHT BREAST LUMPECTOMY WITH RADIOACTIVE SEED LOCALIZATION;  Surgeon: Merrie Roof, MD;  Location: [facility];  Service: General;  Laterality: Right;  . DILATION AND CURETTAGE OF UTERUS     x2  . HERNIA REPAIR     umbil -age 63  . TONSILLECTOMY    . WISDOM TOOTH EXTRACTION      Prior to Admission medications   Medication Sig Start Date End Date Taking? Authorizing Provider  aspirin 81 MG tablet Take 81 mg by mouth daily.   Yes Historical Provider, MD  buPROPion (WELLBUTRIN XL) 300 MG 24 hr tablet Take 300 mg by mouth daily.  10/08/13  Yes Historical Provider, MD  busPIRone (BUSPAR) 15 MG  tablet Take 15 mg by mouth 2 (two) times daily.  08/22/13  Yes Historical Provider, MD  calcium carbonate (OS-CAL) 600 MG TABS tablet Take 600 mg by mouth daily with breakfast.    Yes Historical Provider, MD  cetirizine (ZYRTEC) 10 MG tablet Take 10 mg by mouth daily.   Yes Historical Provider, MD  CRESTOR 10 MG tablet Take 10 mg by mouth daily.  09/23/13  Yes Historical Provider, MD  diazepam (VALIUM) 10 MG tablet Take 10 mg by mouth every 6 (six) hours as needed for anxiety.   Yes Historical Provider, MD  lisinopril (PRINIVIL,ZESTRIL) 5 MG tablet Take 5 mg by mouth daily.  09/19/13  Yes Historical Provider, MD  Multiple Vitamin (MULTIVITAMIN WITH MINERALS) TABS tablet Take 1 tablet by mouth daily.   Yes Historical Provider, MD  MYRBETRIQ 50 MG TB24 tablet Take 50 mg by mouth daily. 07/09/16  Yes Historical Provider, MD  Probiotic Product (PROBIOTIC PO) Take 1 tablet by mouth daily.   Yes Historical Provider, MD  ranitidine (ZANTAC) 150 MG tablet Take 150 mg by mouth 2 (two) times daily.   Yes Historical Provider, MD  tamoxifen (NOLVADEX) 20 MG tablet Take 1 tablet (20 mg total) by mouth daily. 03/19/16  Yes Lennis Marion Downer, MD  venlafaxine (EFFEXOR) 37.5 MG tablet Take 37.5 mg by mouth daily.   Yes Historical Provider, MD  vitamin E 400 UNIT capsule Take 400 Units by mouth daily.   Yes Historical  Provider, MD    Scheduled Meds: . buPROPion  300 mg Oral Daily  . busPIRone  15 mg Oral BID  . famotidine (PEPCID) IV  20 mg Intravenous Q12H  . mirabegron ER  50 mg Oral Daily  . multivitamin with minerals  1 tablet Oral Daily  . pantoprazole (PROTONIX) IV  40 mg Intravenous Q12H  . sodium chloride flush  3 mL Intravenous Q12H  . tamoxifen  20 mg Oral Daily  . venlafaxine  37.5 mg Oral Daily   Continuous Infusions: . sodium chloride     PRN Meds:.acetaminophen **OR** acetaminophen, diazepam, ondansetron **OR** ondansetron (ZOFRAN) IV  Allergies as of 08/06/2016  . (No Known Allergies)     Family History  Problem Relation Age of Onset  . Pneumonia Mother   . Heart attack Father   . Cancer Paternal Grandmother   . Cancer Maternal Grandfather     Social History   Social History  . Marital status: Married    Spouse name: N/A  . Number of children: N/A  . Years of education: N/A   Occupational History  . Not on file.   Social History Main Topics  . Smoking status: Never Smoker  . Smokeless tobacco: Never Used  . Alcohol use 1.0 - 1.5 oz/week    2 - 3 Standard drinks or equivalent per week     Comment: OCCASIONAL  . Drug use: No  . Sexual activity: Not on file   Other Topics Concern  . Not on file   Social History Narrative  . No narrative on file    Review of Systems: All negative except as stated above in HPI. Review of Systems  Constitutional: Negative for chills and fever.  HENT: Negative for ear discharge, ear pain, hearing loss and tinnitus.   Eyes: Negative for blurred vision and double vision.  Respiratory: Negative for cough and hemoptysis.   Cardiovascular: Positive for chest pain. Negative for claudication.  Gastrointestinal: Positive for heartburn. Negative for abdominal pain, blood in stool, constipation and diarrhea.  Genitourinary: Negative for dysuria and hematuria.  Musculoskeletal: Positive for joint pain. Negative for neck pain.  Skin: Negative for rash.  Neurological: Negative for focal weakness and seizures.  Endo/Heme/Allergies: Does not bruise/bleed easily.  Psychiatric/Behavioral: Negative for hallucinations and suicidal ideas.    Physical Exam: Vital signs: Vitals:   08/06/16 1418 08/06/16 1436  BP: 115/74 117/79  Pulse: 89 87  Resp:  16  Temp: 98 F (36.7 C) 99.3 F (37.4 C)     General:   Alert,  Well-developed, well-nourished, pleasant and cooperative in NAD HEENT: NS, AT, EOMI  Sclerae-anicteric Neck-supple. Trachea midline. Lungs: Scattered rhonchi. Good air entry bilaterally Heart:  Regular rate and  rhythm; no murmurs, clicks, rubs,  or gallops. Abdomen: Soft, nontender, nondistended, bowel sounds present. No peritoneal signs Lower extremity-no edema or cyanosis Psych-mood and affect normal. Alert oriented 3 Rectal:  Deferred  GI:  Lab Results:  Recent Labs  08/06/16 0705  WBC 10.7*  HGB 9.2*  HCT 27.8*  PLT 246   BMET  Recent Labs  08/06/16 0705  NA 139  K 3.8  CL 104  CO2 20*  GLUCOSE 122*  BUN 25*  CREATININE 0.97  CALCIUM 9.1   LFT No results for input(s): PROT, ALBUMIN, AST, ALT, ALKPHOS, BILITOT, BILIDIR, IBILI in the last 72 hours. PT/INR No results for input(s): LABPROT, INR in the last 72 hours.   Studies/Results: Dg Chest 2 View  Result Date: 08/06/2016 CLINICAL  DATA:  Chest tightness since yesterday. History of hypertension, hyperlipidemia, right breast malignancy EXAM: CHEST  2 VIEW COMPARISON:  None in PACs FINDINGS: The lungs are adequately inflated and clear. No pulmonary parenchymal nodules or masses are observed. There is no interstitial nor alveolar infiltrate. There is no pleural effusion or pneumothorax. There is mild biapical pleural thickening. The heart and pulmonary vascularity are normal. There is a moderate-sized hiatal hernia. The bony structures are unremarkable. There are surgical vascular clips in the right breast. IMPRESSION: There is no active cardiopulmonary disease. Moderate-sized hiatal hernia. Electronically Signed   By: David  Martinique M.D.   On: 08/06/2016 07:41   Ct Abdomen Pelvis W Contrast  Result Date: 08/06/2016 CLINICAL DATA:  The patient came to the emergency room with chest pain. The patient was found have a hemoglobin of 9.2. No abdominal pain. History of right breast cancer. EXAM: CT ABDOMEN AND PELVIS WITH CONTRAST TECHNIQUE: Multidetector CT imaging of the abdomen and pelvis was performed using the standard protocol following bolus administration of intravenous contrast. CONTRAST:  1 ISOVUE-300 IOPAMIDOL (ISOVUE-300)  INJECTION 61% COMPARISON:  None. FINDINGS: Lower chest: There is a moderate sized hiatal hernia. No nodules, masses, or focal infiltrates. Hepatobiliary: Hepatic steatosis is identified. The gallbladder is normal. The portal vein is normal. Pancreas: Unremarkable. No pancreatic ductal dilatation or surrounding inflammatory changes. Spleen: Normal in size without focal abnormality. Adrenals/Urinary Tract: Adrenal glands are unremarkable. Kidneys are normal, without renal calculi, focal lesion, or hydronephrosis. Bladder is unremarkable. Stomach/Bowel: Other than the hiatal hernia, the stomach is normal. Small bowel is normal as well. The colon and appendix are unremarkable. There is fecal loading throughout the colon however Vascular/Lymphatic: Atherosclerotic changes are seen in the non aneurysmal aorta, minimal. No adenopathy. Reproductive: Status post hysterectomy. No adnexal masses. Other: No abdominal wall hernia or abnormality. No abdominopelvic ascites. Musculoskeletal: No acute or significant osseous findings. IMPRESSION: 1. No cause for the patient's anemia identified. 2. Minimal atherosclerotic change in the abdominal aorta and branching vessels. 3. Moderate hiatal hernia. Electronically Signed   By: Dorise Bullion III M.D   On: 08/06/2016 10:23    Impression/Plan: - Anemia with occult blood Positive stool. No overt bleeding. - Large hiatal hernia per CT scan. - Chest pain/tachycardia  Recommendations ------------------------- - Monitor H&H. Workup to rule out cardiac etiology for her chest pain by primary care physician. Burnis Medin consider EGD once cardiac issues has been ruled out  - Recommend outpatient colonoscopy as last colonoscopy was 10 years ago - GI will follow.    LOS: 0 days   Otis Brace  MD, FACP 08/06/2016, 3:29 PM  Pager [phone] If no answer or after 5 PM call [phone]

## 2016-08-06 NOTE — ED Notes (Signed)
Blood consent signed at bedside.  ?

## 2016-08-06 NOTE — ED Notes (Signed)
Report given.

## 2016-08-06 NOTE — ED Provider Notes (Signed)
[facility] DEPT Provider Note   CSN: [id number] Arrival date & time: 08/06/16  8295     History   Chief Complaint Chief Complaint  Patient presents with  . Tachycardia  . Chest Pain    HPI Rachel Edwards is a 66 y.o. female who presents today with chief complaint of tachycardia, associated with chest pain. Pt reports she has noted her HR has been mildly elevated over the past 3 weeks, as per her activity tracker. She noticed a marked increase in her resting HR on Saturday, stating her hr was 90bpm sitting and over 100 standing/with activity. She endorses lightheadedness but denies syncope with this. She also states she developed 3/10 midsternal chest tightness on Saturday, which does not radiate and is intermittent. Endorses some SOB with this but no diaphoresis or PND. She is on preventative tamoxifen for of DCIS of right breast which is 3 years s/p lumpectomy. Denies recent trauma or surgery, but states she had a colonoscopy 2 years ago that was nondiagnostic and was told to repeat it at some point, which she has not.   Denies hemoptysis, fever/chills, recent surgeries, abd pain, n/v/d, melena, hematochezia, dysuria, hematuria.   The history is provided by the patient and the spouse.    Past Medical History:  Diagnosis Date  . Depression   . Hiatal hernia   . Hyperlipidemia   . Hypertension   . Panic attack   . Uterine fibroid   . Wears glasses     Patient Active Problem List   Diagnosis Date Noted  . Chest pain 08/06/2016  . Status post total hysterectomy and bilateral salpingo-oophorectomy 02/24/2015  . High risk medication use 02/24/2015  . DCIS (ductal carcinoma in situ) of breast 02/24/2015  . Estrogen receptor positive status (ER+) 07/31/2014  . 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  . Obesity, Class I, BMI 30.0-34.9 (see actual BMI) 12/04/2013  . Breast cancer of lower-outer quadrant of right female breast ([place]) 10/09/2013    Past  Surgical History:  Procedure Laterality Date  . ABDOMINAL HYSTERECTOMY  1998  . BREAST LUMPECTOMY WITH RADIOACTIVE SEED LOCALIZATION Right 11/04/2013   Procedure: RIGHT BREAST LUMPECTOMY WITH RADIOACTIVE SEED LOCALIZATION;  Surgeon: Merrie Roof, MD;  Location: [facility];  Service: General;  Laterality: Right;  . DILATION AND CURETTAGE OF UTERUS     x2  . HERNIA REPAIR     umbil -age 89  . TONSILLECTOMY    . WISDOM TOOTH EXTRACTION      OB History    No data available       Home Medications    Prior to Admission medications   Medication Sig Start Date End Date Taking? Authorizing Provider  aspirin 81 MG tablet Take 81 mg by mouth daily.   Yes Historical Provider, MD  buPROPion (WELLBUTRIN XL) 300 MG 24 hr tablet Take 300 mg by mouth daily.  10/08/13  Yes Historical Provider, MD  busPIRone (BUSPAR) 15 MG tablet Take 15 mg by mouth 2 (two) times daily.  08/22/13  Yes Historical Provider, MD  calcium carbonate (OS-CAL) 600 MG TABS tablet Take 600 mg by mouth daily with breakfast.    Yes Historical Provider, MD  cetirizine (ZYRTEC) 10 MG tablet Take 10 mg by mouth daily.   Yes Historical Provider, MD  CRESTOR 10 MG tablet Take 10 mg by mouth daily.  09/23/13  Yes Historical Provider, MD  diazepam (VALIUM) 10 MG tablet Take 10 mg by mouth  every 6 (six) hours as needed for anxiety.   Yes Historical Provider, MD  lisinopril (PRINIVIL,ZESTRIL) 5 MG tablet Take 5 mg by mouth daily.  09/19/13  Yes Historical Provider, MD  Multiple Vitamin (MULTIVITAMIN WITH MINERALS) TABS tablet Take 1 tablet by mouth daily.   Yes Historical Provider, MD  MYRBETRIQ 50 MG TB24 tablet Take 50 mg by mouth daily. 07/09/16  Yes Historical Provider, MD  Probiotic Product (PROBIOTIC PO) Take 1 tablet by mouth daily.   Yes Historical Provider, MD  ranitidine (ZANTAC) 150 MG tablet Take 150 mg by mouth 2 (two) times daily.   Yes Historical Provider, MD  tamoxifen (NOLVADEX) 20 MG tablet Take 1 tablet (20  mg total) by mouth daily. 03/19/16  Yes Lennis Marion Downer, MD  venlafaxine (EFFEXOR) 37.5 MG tablet Take 37.5 mg by mouth daily.   Yes Historical Provider, MD  vitamin E 400 UNIT capsule Take 400 Units by mouth daily.   Yes Historical Provider, MD    Family History Family History  Problem Relation Age of Onset  . Pneumonia Mother   . Heart attack Father   . Cancer Paternal Grandmother   . Cancer Maternal Grandfather     Social History Social History  Substance Use Topics  . Smoking status: Never Smoker  . Smokeless tobacco: Never Used  . Alcohol use 1.0 - 1.5 oz/week    2 - 3 Standard drinks or equivalent per week     Allergies   Patient has no known allergies.   Review of Systems Review of Systems  Constitutional: Negative for appetite change, chills, diaphoresis and fever.  HENT: Positive for trouble swallowing.        Known hiatal hernia, unchanged  Eyes: Negative for discharge.  Respiratory: Positive for chest tightness and shortness of breath.   Cardiovascular: Positive for chest pain and palpitations. Negative for leg swelling.  Gastrointestinal: Negative for abdominal distention, abdominal pain, blood in stool, diarrhea, nausea and vomiting.  Endocrine: Negative for cold intolerance, heat intolerance, polydipsia and polyphagia.  Genitourinary: Negative for dysuria and hematuria.  Musculoskeletal: Negative for arthralgias.  Skin: Positive for pallor.  Neurological: Positive for light-headedness. Negative for syncope.  Hematological: Negative for adenopathy.  Psychiatric/Behavioral: Negative for agitation.     Physical Exam Updated Vital Signs BP 144/90 (BP Location: Left Arm)   Pulse 103   Temp 97.7 F (36.5 C) (Oral)   Resp 22   Ht 5\' 6"  (1.676 m)   Wt 74.8 kg   SpO2 98%   BMI 26.63 kg/m   Physical Exam  Constitutional: She is oriented to person, place, and time. She appears well-developed and well-nourished. No distress.  HENT:  Head:  Normocephalic and atraumatic.  Eyes: Conjunctivae are normal. No scleral icterus.  Pale ginviva, no evidence of chelitis or ulcers, pale palpebral conjunctiva  Neck: Neck supple. No JVD present. No tracheal deviation present. No thyromegaly present.  Cardiovascular: Regular rhythm, normal heart sounds and intact distal pulses.  Exam reveals no gallop and no friction rub.   No murmur heard. Tachycardic  Pulmonary/Chest: Effort normal and breath sounds normal. She has no wheezes. She has no rales. She exhibits no tenderness.  Abdominal: Soft. Bowel sounds are normal. She exhibits no distension. There is no tenderness.  Musculoskeletal: Normal range of motion. She exhibits no edema, tenderness or deformity.  Lymphadenopathy:    She has no cervical adenopathy.  Neurological: She is alert and oriented to person, place, and time.  Skin: Skin is warm and  dry. Capillary refill takes less than 2 seconds. She is not diaphoretic. There is pallor.  Psychiatric: She has a normal mood and affect. Her behavior is normal. Judgment and thought content normal.     ED Treatments / Results  Labs (all labs ordered are listed, but only abnormal results are displayed) Labs Reviewed  BASIC METABOLIC PANEL - Abnormal; Notable for the following:       Result Value   CO2 20 (*)    Glucose, Bld 122 (*)    BUN 25 (*)    GFR calc non Af Amer 60 (*)    All other components within normal limits  CBC - Abnormal; Notable for the following:    WBC 10.7 (*)    RBC 2.96 (*)    Hemoglobin 9.2 (*)    HCT 27.8 (*)    All other components within normal limits  TSH - Abnormal; Notable for the following:    TSH 4.943 (*)    All other components within normal limits  POC OCCULT BLOOD, ED - Abnormal; Notable for the following:    Fecal Occult Bld POSITIVE (*)    All other components within normal limits  T4, FREE  TROPONIN I  TROPONIN I  VITAMIN B12  FOLATE  IRON AND TIBC  FERRITIN  RETICULOCYTES  I-STAT  TROPOININ, ED  TYPE AND SCREEN  PREPARE RBC (CROSSMATCH)    EKG  EKG Interpretation  Date/Time:  Monday August 06 2016 07:04:48 EDT Ventricular Rate:  103 PR Interval:    QRS Duration: 85 QT Interval:  356 QTC Calculation: 466 R Axis:   -19 Text Interpretation:  Sinus tachycardia Borderline left axis deviation Low voltage, precordial leads Borderline T wave abnormalities Artifact Abnormal ekg Confirmed by Carmin Muskrat  MD [phone]) on 08/06/2016 9:00:40 AM       Radiology Dg Chest 2 View  Result Date: 08/06/2016 CLINICAL DATA:  Chest tightness since yesterday. History of hypertension, hyperlipidemia, right breast malignancy EXAM: CHEST  2 VIEW COMPARISON:  None in PACs FINDINGS: The lungs are adequately inflated and clear. No pulmonary parenchymal nodules or masses are observed. There is no interstitial nor alveolar infiltrate. There is no pleural effusion or pneumothorax. There is mild biapical pleural thickening. The heart and pulmonary vascularity are normal. There is a moderate-sized hiatal hernia. The bony structures are unremarkable. There are surgical vascular clips in the right breast. IMPRESSION: There is no active cardiopulmonary disease. Moderate-sized hiatal hernia. Electronically Signed   By: David  Martinique M.D.   On: 08/06/2016 07:41   Ct Abdomen Pelvis W Contrast  Result Date: 08/06/2016 CLINICAL DATA:  The patient came to the emergency room with chest pain. The patient was found have a hemoglobin of 9.2. No abdominal pain. History of right breast cancer. EXAM: CT ABDOMEN AND PELVIS WITH CONTRAST TECHNIQUE: Multidetector CT imaging of the abdomen and pelvis was performed using the standard protocol following bolus administration of intravenous contrast. CONTRAST:  1 ISOVUE-300 IOPAMIDOL (ISOVUE-300) INJECTION 61% COMPARISON:  None. FINDINGS: Lower chest: There is a moderate sized hiatal hernia. No nodules, masses, or focal infiltrates. Hepatobiliary: Hepatic steatosis is  identified. The gallbladder is normal. The portal vein is normal. Pancreas: Unremarkable. No pancreatic ductal dilatation or surrounding inflammatory changes. Spleen: Normal in size without focal abnormality. Adrenals/Urinary Tract: Adrenal glands are unremarkable. Kidneys are normal, without renal calculi, focal lesion, or hydronephrosis. Bladder is unremarkable. Stomach/Bowel: Other than the hiatal hernia, the stomach is normal. Small bowel is normal as well. The colon  and appendix are unremarkable. There is fecal loading throughout the colon however Vascular/Lymphatic: Atherosclerotic changes are seen in the non aneurysmal aorta, minimal. No adenopathy. Reproductive: Status post hysterectomy. No adnexal masses. Other: No abdominal wall hernia or abnormality. No abdominopelvic ascites. Musculoskeletal: No acute or significant osseous findings. IMPRESSION: 1. No cause for the patient's anemia identified. 2. Minimal atherosclerotic change in the abdominal aorta and branching vessels. 3. Moderate hiatal hernia. Electronically Signed   By: Dorise Bullion III M.D   On: 08/06/2016 10:23    Procedures Procedures (including critical care time)  Medications Ordered in ED Medications  0.9 %  sodium chloride infusion (not administered)  busPIRone (BUSPAR) tablet 15 mg (not administered)  buPROPion (WELLBUTRIN XL) 24 hr tablet 300 mg (not administered)  rosuvastatin (CRESTOR) tablet 10 mg (not administered)  diazepam (VALIUM) tablet 10 mg (not administered)  mirabegron ER (MYRBETRIQ) tablet 50 mg (not administered)  famotidine (PEPCID) tablet 20 mg (not administered)  tamoxifen (NOLVADEX) tablet 20 mg (not administered)  venlafaxine (EFFEXOR) tablet 37.5 mg (not administered)  0.9 %  sodium chloride infusion (not administered)  sodium chloride 0.9 % bolus 1,000 mL (0 mLs Intravenous Stopped 08/06/16 1113)  iopamidol (ISOVUE-300) 61 % injection (100 mLs Intravenous Contrast Given 08/06/16 0942)      Initial Impression / Assessment and Plan / ED Course  I have reviewed the triage vital signs and the nursing notes.  Pertinent labs & imaging results that were available during my care of the patient were reviewed by me and considered in my medical decision making (see chart for details).     65yof presents to ED with chief complaint tachycardia, associated with mild substernal chest tightness. Pt afebrile with HR 103, slightly hypertensive at 140/90 (normotensive on re-evaluation); EKG shows sinus tachycardia with no evidence of ischemia; Troponin negative; suspicion of ACS low. CXR clear and pt O2 sat 98% on RA; low likelihood of PE. CBC shows nonspecific mild elevation of WBC count (unlikely infection as pt is afebrile) and hgb/hct 9.2/27.8, which is a marked decrease compared to 07/2015 which was within normal limits. Pt given 1L NS bolus. Orthostatic BPs obtained with no significant change. Performed hemoccult here which was positive. Obtained CT abdomen and pelvis w/contrast to evaluate source of bleed further, which showed known hiatal hernia but no cause for pt's anemia. Given hx breast cancer and nondiagnostic colonoscopy, further evaluation of new onset anemia recommended. Pt will be brought in to hospital for further workup.   Pt seen and evaluated by Dr. Vanita Panda. Final Clinical Impressions(s) / ED Diagnoses   Final diagnoses:  None    New Prescriptions New Prescriptions   No medications on file     Renita Papa, [place] 08/06/16 1231    Carmin Muskrat, MD 08/06/16 1553

## 2016-08-06 NOTE — ED Notes (Signed)
Report attempted 

## 2016-08-06 NOTE — ED Notes (Signed)
Pt given ice water, per Dr. Vanita Panda.

## 2016-08-06 NOTE — ED Notes (Signed)
Patient transported to CT 

## 2016-08-06 NOTE — H&P (Signed)
History and Physical    Rachel Edwards ZMO:[id number] DOB: 1951-04-05 DOA: 08/06/2016   PCP: Gara Kroner, MD   Patient coming from/Resides with: Private residence/husband  Admission status: Observation/telemetry -it may be medically necessary to stay a minimum 2 midnights to rule out impending and/or unexpected changes in physiologic status that may differ from initial evaluation performed in the ER and/or at time of admission therefore consider reevaluation of admission status in 24 hours.   Chief Complaint: Tachycardia/chest pain-incidental finding of new anemia and occult blood positive state  HPI: Rachel Edwards is a 66 y.o. 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 Prior to these episodes patient has never had an episode of chest pain. She has not had unexplained dyspnea on exertion as well. Since the development of the increased heart rate she has noticed increased weakness and increased shortness of breath. She does have a family history of premature CAD primarily in the female members of the family with a brother dying at age 96 and father dying at age 28 of MI. All of the women in her family do not have any issues with CAD. After arrival to the ER patient was found have mild tachycardia. Lab work revealed a new anemia of hemoglobin 9.2 which was fecal occult blood positive. Patient has not noticed any dark or red stools. She has not had any rectal pain or discomfort with passage of bowel movements and has not seen any bright red blood with wiping. She has noticed increased reflux symptoms and some difficulty swallowing and increased indigestion symptoms. Back in November she took an extended course of an anti-inflammatory which she did  not complete until about January 2018. This was taken for plantar fasciitis.  ED Course:  Vital Signs: BP 144/90 (BP Location: Left Arm)   Pulse 103   Temp 97.7 F (36.5 C) (Oral)   Resp 22   Ht 5\' 6"  (1.676 m)   Wt 74.8 kg (165 lb)   SpO2 98%   BMI 26.63 kg/m  2 view CXR: No acute process, moderate size hiatal hernia CT abdomen and pelvis with contrast: No acute findings, moderate hiatal hernia Lab data: Sodium 139, potassium 3.8, chloride 14, CO2 20, glucose 122, BUN 25, creatinine 0.97, poc troponin 0.00, white count 10,700 differential not obtained, hemoglobin 9.2, hematocrit 27.8, platelets 246,000, TSH 4.943 with free T4 normal 0.70, FOB positive Medications and treatments: Normal saline bolus 1 L  Review of Systems:  In addition to the HPI above,  No Fever-chills, myalgias or other constitutional symptoms No Headache, changes with Vision or hearing, new weakness, tingling, numbness in any extremity, dysarthria or word finding difficulty, gait disturbance or imbalance, tremors or seizure activity No problems swallowing food or Liquids, indigestion/reflux, choking or coughing while eating, abdominal pain with or after eating No Cough, orthopnea  No Abdominal pain, N/V, melena,hematochezia, dark tarry stools, constipation No dysuria, malodorous urine, hematuria or flank pain No new skin rashes, lesions, masses or bruises, No new joint pains, aches, swelling or redness No recent unintentional weight gain or loss No polyuria, polydypsia or polyphagia   Past Medical History:  Diagnosis Date  . Depression   . Hiatal hernia   . Hyperlipidemia   . Hypertension   . Panic attack   .  Uterine fibroid   . Wears glasses     Past Surgical History:  Procedure Laterality Date  . ABDOMINAL HYSTERECTOMY  1998  . BREAST LUMPECTOMY WITH RADIOACTIVE SEED LOCALIZATION Right 11/04/2013   Procedure: RIGHT BREAST LUMPECTOMY WITH RADIOACTIVE SEED LOCALIZATION;  Surgeon: Merrie Roof, MD;   Location: [facility];  Service: General;  Laterality: Right;  . DILATION AND CURETTAGE OF UTERUS     x2  . HERNIA REPAIR     umbil -age 22  . TONSILLECTOMY    . WISDOM TOOTH EXTRACTION      Social History   Social History  . Marital status: Married    Spouse name: N/A  . Number of children: N/A  . Years of education: N/A   Occupational History  . Not on file.   Social History Main Topics  . Smoking status: Never Smoker  . Smokeless tobacco: Never Used  . Alcohol use 1.0 - 1.5 oz/week    2 - 3 Standard drinks or equivalent per week  . Drug use: No  . Sexual activity: Not on file   Other Topics Concern  . Not on file   Social History Narrative  . No narrative on file    Mobility: Without assistive devices Work history: Not obtained   No Known Allergies  Family History  Problem Relation Age of Onset  . Pneumonia Mother   . Heart attack (premature CAD ) Father and    . Cancer Paternal Grandmother   . Cancer Maternal Grandfather      Prior to Admission medications   Medication Sig Start Date End Date Taking? Authorizing Provider  aspirin 81 MG tablet Take 81 mg by mouth daily.   Yes Historical Provider, MD  buPROPion (WELLBUTRIN XL) 300 MG 24 hr tablet Take 300 mg by mouth daily.  10/08/13  Yes Historical Provider, MD  busPIRone (BUSPAR) 15 MG tablet Take 15 mg by mouth 2 (two) times daily.  08/22/13  Yes Historical Provider, MD  calcium carbonate (OS-CAL) 600 MG TABS tablet Take 600 mg by mouth daily with breakfast.    Yes Historical Provider, MD  cetirizine (ZYRTEC) 10 MG tablet Take 10 mg by mouth daily.   Yes Historical Provider, MD  CRESTOR 10 MG tablet Take 10 mg by mouth daily.  09/23/13  Yes Historical Provider, MD  diazepam (VALIUM) 10 MG tablet Take 10 mg by mouth every 6 (six) hours as needed for anxiety.   Yes Historical Provider, MD  lisinopril (PRINIVIL,ZESTRIL) 5 MG tablet Take 5 mg by mouth daily.  09/19/13  Yes Historical Provider, MD    Multiple Vitamin (MULTIVITAMIN WITH MINERALS) TABS tablet Take 1 tablet by mouth daily.   Yes Historical Provider, MD  MYRBETRIQ 50 MG TB24 tablet Take 50 mg by mouth daily. 07/09/16  Yes Historical Provider, MD  Probiotic Product (PROBIOTIC PO) Take 1 tablet by mouth daily.   Yes Historical Provider, MD  ranitidine (ZANTAC) 150 MG tablet Take 150 mg by mouth 2 (two) times daily.   Yes Historical Provider, MD  tamoxifen (NOLVADEX) 20 MG tablet Take 1 tablet (20 mg total) by mouth daily. 03/19/16  Yes Lennis Marion Downer, MD  venlafaxine (EFFEXOR) 37.5 MG tablet Take 37.5 mg by mouth daily.   Yes Historical Provider, MD  vitamin E 400 UNIT capsule Take 400 Units by mouth daily.   Yes Historical Provider, MD    Physical Exam: Vitals:   08/06/16 0700 08/06/16 0704  BP:  144/90  Pulse:  103  Resp:  22  Temp:  97.7 F (36.5 C)  TempSrc:  Oral  SpO2:  98%  Weight: 74.8 kg (165 lb)   Height: 5\' 6"  (1.676 m)       Constitutional: NAD, calm, comfortable-Overall pale appearance Eyes: PERRL, lids and sclera normal, bilateral conjunctiva are pale ENMT: Mucous membranes are moist. Posterior pharynx clear of any exudate or lesions.Normal dentition.  Neck: normal, supple, no masses, no thyromegaly Respiratory: clear to auscultation bilaterally, no wheezing, no crackles. Normal respiratory effort. No accessory muscle use.  Cardiovascular: Regular mildly tachycardic rate and rhythm, no murmurs / rubs / gallops. No extremity edema. 2+ pedal pulses. No carotid bruits.  Abdomen: no tenderness, no masses palpated. No hepatosplenomegaly. Bowel sounds positive.  Musculoskeletal: no clubbing / cyanosis. No joint deformity upper and lower extremities. Good ROM, no contractures. Normal muscle tone.  Skin: no rashes, lesions, ulcers. No induration Neurologic: CN 2-12 grossly intact. Sensation intact, DTR normal. Strength 5/5 x all 4 extremities.  Psychiatric: Normal judgment and insight. Alert and oriented x  3. Normal mood.    Labs on Admission: I have personally reviewed following labs and imaging studies  CBC:  Recent Labs Lab 08/06/16 0705  WBC 10.7*  HGB 9.2*  HCT 27.8*  MCV 93.9  PLT 443   Basic Metabolic Panel:  Recent Labs Lab 08/06/16 0705  NA 139  K 3.8  CL 104  CO2 20*  GLUCOSE 122*  BUN 25*  CREATININE 0.97  CALCIUM 9.1   GFR: Estimated Creatinine Clearance: 59.8 mL/min (by C-G formula based on SCr of 0.97 mg/dL). Liver Function Tests: No results for input(s): AST, ALT, ALKPHOS, BILITOT, PROT, ALBUMIN in the last 168 hours. No results for input(s): LIPASE, AMYLASE in the last 168 hours. No results for input(s): AMMONIA in the last 168 hours. Coagulation Profile: No results for input(s): INR, PROTIME in the last 168 hours. Cardiac Enzymes: No results for input(s): CKTOTAL, CKMB, CKMBINDEX, TROPONINI in the last 168 hours. BNP (last 3 results) No results for input(s): PROBNP in the last 8760 hours. HbA1C: No results for input(s): HGBA1C in the last 72 hours. CBG: No results for input(s): GLUCAP in the last 168 hours. Lipid Profile: No results for input(s): CHOL, HDL, LDLCALC, TRIG, CHOLHDL, LDLDIRECT in the last 72 hours. Thyroid Function Tests:  Recent Labs  08/06/16 0705  TSH 4.943*  FREET4 0.70   Anemia Panel: No results for input(s): VITAMINB12, FOLATE, FERRITIN, TIBC, IRON, RETICCTPCT in the last 72 hours. Urine analysis: No results found for: COLORURINE, APPEARANCEUR, LABSPEC, PHURINE, GLUCOSEU, HGBUR, BILIRUBINUR, KETONESUR, PROTEINUR, UROBILINOGEN, NITRITE, LEUKOCYTESUR Sepsis Labs: @LABRCNTIP (procalcitonin:4,lacticidven:4) )No results found for this or any previous visit (from the past 240 hour(s)).   Radiological Exams on Admission: Dg Chest 2 View  Result Date: 08/06/2016 CLINICAL DATA:  Chest tightness since yesterday. History of hypertension, hyperlipidemia, right breast malignancy EXAM: CHEST  2 VIEW COMPARISON:  None in PACs  FINDINGS: The lungs are adequately inflated and clear. No pulmonary parenchymal nodules or masses are observed. There is no interstitial nor alveolar infiltrate. There is no pleural effusion or pneumothorax. There is mild biapical pleural thickening. The heart and pulmonary vascularity are normal. There is a moderate-sized hiatal hernia. The bony structures are unremarkable. There are surgical vascular clips in the right breast. IMPRESSION: There is no active cardiopulmonary disease. Moderate-sized hiatal hernia. Electronically Signed   By: David  Martinique M.D.   On: 08/06/2016 07:41   Ct Abdomen Pelvis W Contrast  Result Date: 08/06/2016 CLINICAL DATA:  The patient came to the emergency room with chest pain. The patient was found have a hemoglobin of 9.2. No abdominal pain. History of right breast cancer. EXAM: CT ABDOMEN AND PELVIS WITH CONTRAST TECHNIQUE: Multidetector CT imaging of the abdomen and pelvis was performed using the standard protocol following bolus administration of intravenous contrast. CONTRAST:  1 ISOVUE-300 IOPAMIDOL (ISOVUE-300) INJECTION 61% COMPARISON:  None. FINDINGS: Lower chest: There is a moderate sized hiatal hernia. No nodules, masses, or focal infiltrates. Hepatobiliary: Hepatic steatosis is identified. The gallbladder is normal. The portal vein is normal. Pancreas: Unremarkable. No pancreatic ductal dilatation or surrounding inflammatory changes. Spleen: Normal in size without focal abnormality. Adrenals/Urinary Tract: Adrenal glands are unremarkable. Kidneys are normal, without renal calculi, focal lesion, or hydronephrosis. Bladder is unremarkable. Stomach/Bowel: Other than the hiatal hernia, the stomach is normal. Small bowel is normal as well. The colon and appendix are unremarkable. There is fecal loading throughout the colon however Vascular/Lymphatic: Atherosclerotic changes are seen in the non aneurysmal aorta, minimal. No adenopathy. Reproductive: Status post hysterectomy.  No adnexal masses. Other: No abdominal wall hernia or abnormality. No abdominopelvic ascites. Musculoskeletal: No acute or significant osseous findings. IMPRESSION: 1. No cause for the patient's anemia identified. 2. Minimal atherosclerotic change in the abdominal aorta and branching vessels. 3. Moderate hiatal hernia. Electronically Signed   By: Dorise Bullion III M.D   On: 08/06/2016 10:23    EKG: (Independently reviewed) sinus tachycardia with ventricular rate 103 bpm, QTC 466 ms, no acute ischemic changes  Assessment/Plan Principal Problem:   Chest pain/exertional tachycardia -Presents with chest pain that occurs after and only with tachycardia -Suspect symptoms driven by symptomatic anemia and occult GI blood loss -For completeness of exam will treat as possible ischemic in etiology -Cycle troponin -Echo -Since concern over possible GI bleeding/symptomatic anemia as etiology will not give aspirin or anticoagulation -TSH mildly elevated but has normal free T4  Active Problems:   Symptomatic anemia/  Fecal occult blood test positive -Baseline hemoglobin March 2017 was 12 -Current hemoglobin 9.2 -Transfuse 2 units PRBCs and repeat CBC after transfusion -FOB positive and given known large hernia and recent reflux and indigestion symptoms as well as difficulty swallowing consider upper GI source -BUN also mildly elevated also suggestive of upper GI source -IV PPI and H2 blocker -Clear liquids only -Reports last colonoscopy greater than 10 years ago [facility] gastroenterology consulted    Ductal carcinoma in situ (DCIS) of right breast -Continue tamoxifen    Obesity, Class I, BMI 30.0-34.9 (see actual BMI)    Situational depression -Continue preadmission medications      DVT prophylaxis: SCDs  Code Status: Full Family Communication: Husband at bedside  Disposition Plan: Home Consults called: Gastroenterology/[facility] on call MD    Samella Parr ANP-BC [facility] Pager [phone]   If 7PM-7AM, please contact night-coverage [email or web address] Password [facility]  08/06/2016, 12:08 PM

## 2016-08-06 NOTE — ED Triage Notes (Signed)
Pt reports a resting heart rate of 95 while sitting and 115 while standing. Pt states that is higher then normal for her. Pt also reports chest tightness on the left side.

## 2016-08-07 ENCOUNTER — Observation Stay (HOSPITAL_BASED_OUTPATIENT_CLINIC_OR_DEPARTMENT_OTHER): Payer: Medicare Other

## 2016-08-07 DIAGNOSIS — R079 Chest pain, unspecified: Secondary | ICD-10-CM | POA: Diagnosis not present

## 2016-08-07 DIAGNOSIS — E669 Obesity, unspecified: Secondary | ICD-10-CM | POA: Diagnosis not present

## 2016-08-07 DIAGNOSIS — R000 Tachycardia, unspecified: Secondary | ICD-10-CM

## 2016-08-07 DIAGNOSIS — F4321 Adjustment disorder with depressed mood: Secondary | ICD-10-CM | POA: Diagnosis not present

## 2016-08-07 DIAGNOSIS — D649 Anemia, unspecified: Secondary | ICD-10-CM | POA: Diagnosis not present

## 2016-08-07 DIAGNOSIS — R195 Other fecal abnormalities: Secondary | ICD-10-CM | POA: Diagnosis not present

## 2016-08-07 LAB — ECHOCARDIOGRAM COMPLETE
Height: 65 in
Weight: 2920 oz

## 2016-08-07 LAB — TYPE AND SCREEN
ABO/RH(D): O POS
Antibody Screen: NEGATIVE
UNIT DIVISION: 0
Unit division: 0

## 2016-08-07 LAB — BPAM RBC
Blood Product Expiration Date: 201804032359
Blood Product Expiration Date: 201804042359
ISSUE DATE / TIME: 201803121745
ISSUE DATE / TIME: 201803121410
UNIT TYPE AND RH: 5100
UNIT TYPE AND RH: 5100

## 2016-08-07 LAB — TROPONIN I

## 2016-08-07 MED ORDER — SODIUM CHLORIDE 0.9 % IV SOLN: INTRAVENOUS | Status: DC

## 2016-08-07 MED ORDER — LORATADINE 10 MG PO TABS
10.0000 mg | ORAL_TABLET | Freq: Every day | ORAL | Status: DC
  Administered 2016-08-07 – 2016-08-08 (×2): 10 mg via ORAL
  Filled 2016-08-07 (×2): qty 1

## 2016-08-07 MED ORDER — LORATADINE 10 MG PO TABS: 10.0000 mg | ORAL_TABLET | Freq: Every day | ORAL | Status: DC

## 2016-08-07 NOTE — Progress Notes (Signed)
  Echocardiogram 2D Echocardiogram has been performed.  Rachel Edwards 08/07/2016, 12:33 PM

## 2016-08-07 NOTE — Progress Notes (Signed)
CONSULTATION NOTE  Reason for Consult: Chest pain, tachycardia   Requesting Physician: Dr. Eliseo Squires  Cardiologist: None (NEW)  HPI: This is a 66 y.o. 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 She presents to the ER with more than 10 days of tachycardia and chest tightness. She says it had come on gradually, but was worse this weekend. On admission she was noted to be anemic with hemoglobin of 9, decreased from hemoglobin of 12 one year ago (she says her Hgb was 13 at her OB/GYN on August 2017). She was found to be Hemoccult positive but denied any melena or hematochezia and there was no evidence for acute bright red blood per rectum. Despite these findings she was transfused for what was deemed to be symptomatic anemia. She received 2 units of packed red blood cells although this is against current transfusion protocol. She ruled out for acute MI with 3 negative troponins. Cardiology is asked to evaluate prior to GI evaluation of Hemoccult-positive stool. Family history significant for father who died of MI in his 37's, but was a heavy smoker and brother who died suddenly in his sleep in his 2's.  PMHx:  Past Medical History:  Diagnosis Date  . Anemia 08/06/2016  . Depression   . Hiatal hernia   . Hyperlipidemia   . Hypertension   . Panic attack   . Uterine fibroid   . Wears glasses    Past Surgical History:  Procedure Laterality Date  . ABDOMINAL HYSTERECTOMY  1998  . BREAST LUMPECTOMY WITH RADIOACTIVE SEED LOCALIZATION Right 11/04/2013   Procedure: RIGHT BREAST LUMPECTOMY WITH RADIOACTIVE SEED LOCALIZATION;  Surgeon: Merrie Roof, MD;  Location: [place];  Service: General;  Laterality: Right;  . DILATION AND CURETTAGE OF UTERUS     x2  . HERNIA REPAIR     umbil -age 24  . TONSILLECTOMY    . WISDOM TOOTH  EXTRACTION      FAMHx: Family History  Problem Relation Age of Onset  . Pneumonia Mother   . Heart attack Father   . Cancer Paternal Grandmother   . Cancer Maternal Grandfather     SOCHx:  reports that she has never smoked. She has never used smokeless tobacco. She reports that she drinks about 1.0 - 1.5 oz of alcohol per week . She reports that she does not use drugs.  ALLERGIES: No Known Allergies  ROS: Pertinent items noted in HPI and remainder of comprehensive ROS otherwise negative.  HOME MEDICATIONS: No current facility-administered medications on file prior to encounter.    Current Outpatient Prescriptions on File Prior to Encounter  Medication Sig Dispense Refill  . aspirin 81 MG tablet Take 81 mg by mouth daily.    Marland Kitchen buPROPion (WELLBUTRIN XL) 300 MG 24 hr tablet Take 300 mg by mouth daily.     . busPIRone (BUSPAR) 15 MG tablet Take 15 mg by mouth 2 (two) times daily.     . calcium carbonate (OS-CAL) 600 MG TABS tablet Take 600 mg by mouth daily with breakfast.     . CRESTOR 10 MG tablet Take 10 mg by mouth daily.     Marland Kitchen lisinopril (PRINIVIL,ZESTRIL) 5 MG tablet Take 5 mg by mouth daily.     . tamoxifen (NOLVADEX) 20 MG tablet Take 1 tablet (20 mg total) by mouth daily. 90 tablet 1  HOSPITAL MEDICATIONS: Prior to Admission:  Prescriptions Prior to Admission  Medication Sig Dispense Refill Last Dose  . aspirin 81 MG tablet Take 81 mg by mouth daily.   08/05/2016 at Unknown time  . buPROPion (WELLBUTRIN XL) 300 MG 24 hr tablet Take 300 mg by mouth daily.    08/05/2016 at Unknown time  . busPIRone (BUSPAR) 15 MG tablet Take 15 mg by mouth 2 (two) times daily.    08/05/2016 at Unknown time  . calcium carbonate (OS-CAL) 600 MG TABS tablet Take 600 mg by mouth daily with breakfast.    08/05/2016 at Unknown time  . cetirizine (ZYRTEC) 10 MG tablet Take 10 mg by mouth daily.   08/05/2016 at Unknown time  . CRESTOR 10 MG tablet Take 10 mg by mouth daily.    08/05/2016 at Unknown  time  . diazepam (VALIUM) 10 MG tablet Take 10 mg by mouth every 6 (six) hours as needed for anxiety.   Past Month at Unknown time  . lisinopril (PRINIVIL,ZESTRIL) 5 MG tablet Take 5 mg by mouth daily.    08/05/2016 at Unknown time  . Multiple Vitamin (MULTIVITAMIN WITH MINERALS) TABS tablet Take 1 tablet by mouth daily.   08/05/2016 at Unknown time  . MYRBETRIQ 50 MG TB24 tablet Take 50 mg by mouth daily.  12 08/05/2016 at Unknown time  . Probiotic Product (PROBIOTIC PO) Take 1 tablet by mouth daily.   08/05/2016 at Unknown time  . ranitidine (ZANTAC) 150 MG tablet Take 150 mg by mouth 2 (two) times daily.   08/05/2016 at Unknown time  . tamoxifen (NOLVADEX) 20 MG tablet Take 1 tablet (20 mg total) by mouth daily. 90 tablet 1 08/05/2016 at Unknown time  . venlafaxine (EFFEXOR) 37.5 MG tablet Take 37.5 mg by mouth daily.   08/05/2016 at Unknown time  . vitamin E 400 UNIT capsule Take 400 Units by mouth daily.   08/05/2016 at Unknown time    VITALS: Blood pressure 133/76, pulse 77, temperature 98.1 F (36.7 C), temperature source Oral, resp. rate 13, height 5' 5" (1.651 m), weight 182 lb 8 oz (82.8 kg), SpO2 97 %.  PHYSICAL EXAM: General appearance: alert and no distress Neck: no carotid bruit and no JVD Lungs: clear to auscultation bilaterally Heart: regular rate and rhythm, S1, S2 normal, no murmur, click, rub or gallop Abdomen: soft, non-tender; bowel sounds normal; no masses,  no organomegaly Extremities: extremities normal, atraumatic, no cyanosis or edema Pulses: 2+ and symmetric Skin: Skin color, texture, turgor normal. No rashes or lesions Neurologic: Grossly normal Psych: Mildly anxious  LABS: Results for orders placed or performed during the hospital encounter of 08/06/16 (from the past 48 hour(s))  Basic metabolic panel     Status: Abnormal   Collection Time: 08/06/16  7:05 AM  Result Value Ref Range   Sodium 139 135 - 145 mmol/L   Potassium 3.8 3.5 - 5.1 mmol/L   Chloride 104  101 - 111 mmol/L   CO2 20 (L) 22 - 32 mmol/L   Glucose, Bld 122 (H) 65 - 99 mg/dL   BUN 25 (H) 6 - 20 mg/dL   Creatinine, Ser 0.97 0.44 - 1.00 mg/dL   Calcium 9.1 8.9 - 10.3 mg/dL   GFR calc non Af Amer 60 (L) >60 mL/min   GFR calc Af Amer >60 >60 mL/min    Comment: (NOTE) The eGFR has been calculated using the CKD EPI equation. This calculation has not been validated in all clinical situations. eGFR's persistently <  60 mL/min signify possible Chronic Kidney Disease.    Anion gap 15 5 - 15  CBC     Status: Abnormal   Collection Time: 08/06/16  7:05 AM  Result Value Ref Range   WBC 10.7 (H) 4.0 - 10.5 K/uL   RBC 2.96 (L) 3.87 - 5.11 MIL/uL   Hemoglobin 9.2 (L) 12.0 - 15.0 g/dL   HCT 27.8 (L) 36.0 - 46.0 %   MCV 93.9 78.0 - 100.0 fL   MCH 31.1 26.0 - 34.0 pg   MCHC 33.1 30.0 - 36.0 g/dL   RDW 12.9 11.5 - 15.5 %   Platelets 246 150 - 400 K/uL  TSH     Status: Abnormal   Collection Time: 08/06/16  7:05 AM  Result Value Ref Range   TSH 4.943 (H) 0.350 - 4.500 uIU/mL    Comment: Performed by a 3rd Generation assay with a functional sensitivity of <=0.01 uIU/mL.  T4, free     Status: None   Collection Time: 08/06/16  7:05 AM  Result Value Ref Range   Free T4 0.70 0.61 - 1.12 ng/dL    Comment: (NOTE) Biotin ingestion may interfere with free T4 tests. If the results are inconsistent with the TSH level, previous test results, or the clinical presentation, then consider biotin interference. If needed, order repeat testing after stopping biotin.   I-stat troponin, ED     Status: None   Collection Time: 08/06/16  7:14 AM  Result Value Ref Range   Troponin i, poc 0.00 0.00 - 0.08 ng/mL   Comment 3            Comment: Due to the release kinetics of cTnI, a negative result within the first hours of the onset of symptoms does not rule out myocardial infarction with certainty. If myocardial infarction is still suspected, repeat the test at appropriate intervals.   POC occult blood,  ED Provider will collect     Status: Abnormal   Collection Time: 08/06/16  8:46 AM  Result Value Ref Range   Fecal Occult Bld POSITIVE (A) NEGATIVE  Folate     Status: None   Collection Time: 08/06/16 11:00 AM  Result Value Ref Range   Folate 29.7 >5.9 ng/mL  Vitamin B12     Status: None   Collection Time: 08/06/16 11:10 AM  Result Value Ref Range   Vitamin B-12 352 180 - 914 pg/mL    Comment: (NOTE) This assay is not validated for testing neonatal or myeloproliferative syndrome specimens for Vitamin B12 levels.   Iron and TIBC     Status: None   Collection Time: 08/06/16 11:10 AM  Result Value Ref Range   Iron 43 28 - 170 ug/dL   TIBC 325 250 - 450 ug/dL   Saturation Ratios 13 10.4 - 31.8 %   UIBC 282 ug/dL  Ferritin     Status: None   Collection Time: 08/06/16 11:10 AM  Result Value Ref Range   Ferritin 34 11 - 307 ng/mL  Reticulocytes     Status: Abnormal   Collection Time: 08/06/16 11:10 AM  Result Value Ref Range   Retic Ct Pct 2.0 0.4 - 3.1 %   RBC. 2.55 (L) 3.87 - 5.11 MIL/uL   Retic Count, Manual 51.0 19.0 - 186.0 K/uL  Prepare RBC     Status: None   Collection Time: 08/06/16 11:40 AM  Result Value Ref Range   Order Confirmation ORDER PROCESSED BY BLOOD BANK   Type and screen  [place]     Status: None   Collection Time: 08/06/16 12:02 PM  Result Value Ref Range   ABO/RH(D) O POS    Antibody Screen NEG    Sample Expiration 08/09/2016    Unit Number [id number]    Blood Component Type RED CELLS,LR    Unit division 00    Status of Unit ISSUED,FINAL    Transfusion Status OK TO TRANSFUSE    Crossmatch Result Compatible    Unit Number [id number]    Blood Component Type RED CELLS,LR    Unit division 00    Status of Unit ISSUED,FINAL    Transfusion Status OK TO TRANSFUSE    Crossmatch Result Compatible   ABO/Rh     Status: None   Collection Time: 08/06/16 12:02 PM  Result Value Ref Range   ABO/RH(D) O POS   Troponin I (q 6hr x 3)      Status: None   Collection Time: 08/06/16  1:54 PM  Result Value Ref Range   Troponin I <0.03 <0.03 ng/mL  Magnesium     Status: None   Collection Time: 08/06/16  1:54 PM  Result Value Ref Range   Magnesium 2.0 1.7 - 2.4 mg/dL  Troponin I     Status: None   Collection Time: 08/06/16 10:24 PM  Result Value Ref Range   Troponin I <0.03 <0.03 ng/mL  CBC     Status: Abnormal   Collection Time: 08/06/16 10:24 PM  Result Value Ref Range   WBC 6.2 4.0 - 10.5 K/uL   RBC 3.58 (L) 3.87 - 5.11 MIL/uL   Hemoglobin 10.5 (L) 12.0 - 15.0 g/dL   HCT 32.2 (L) 36.0 - 46.0 %   MCV 89.9 78.0 - 100.0 fL   MCH 29.3 26.0 - 34.0 pg   MCHC 32.6 30.0 - 36.0 g/dL   RDW 15.1 11.5 - 15.5 %   Platelets 182 150 - 400 K/uL  Troponin I (q 6hr x 3)     Status: None   Collection Time: 08/07/16  1:48 AM  Result Value Ref Range   Troponin I <0.03 <0.03 ng/mL    IMAGING: Dg Chest 2 View  Result Date: 08/06/2016 CLINICAL DATA:  Chest tightness since yesterday. History of hypertension, hyperlipidemia, right breast malignancy EXAM: CHEST  2 VIEW COMPARISON:  None in PACs FINDINGS: The lungs are adequately inflated and clear. No pulmonary parenchymal nodules or masses are observed. There is no interstitial nor alveolar infiltrate. There is no pleural effusion or pneumothorax. There is mild biapical pleural thickening. The heart and pulmonary vascularity are normal. There is a moderate-sized hiatal hernia. The bony structures are unremarkable. There are surgical vascular clips in the right breast. IMPRESSION: There is no active cardiopulmonary disease. Moderate-sized hiatal hernia. Electronically Signed   By: David  Martinique M.D.   On: 08/06/2016 07:41   Ct Abdomen Pelvis W Contrast  Result Date: 08/06/2016 CLINICAL DATA:  The patient came to the emergency room with chest pain. The patient was found have a hemoglobin of 9.2. No abdominal pain. History of right breast cancer. EXAM: CT ABDOMEN AND PELVIS WITH CONTRAST  TECHNIQUE: Multidetector CT imaging of the abdomen and pelvis was performed using the standard protocol following bolus administration of intravenous contrast. CONTRAST:  1 ISOVUE-300 IOPAMIDOL (ISOVUE-300) INJECTION 61% COMPARISON:  None. FINDINGS: Lower chest: There is a moderate sized hiatal hernia. No nodules, masses, or focal infiltrates. Hepatobiliary: Hepatic steatosis is identified. The gallbladder is normal. The portal vein is  normal. Pancreas: Unremarkable. No pancreatic ductal dilatation or surrounding inflammatory changes. Spleen: Normal in size without focal abnormality. Adrenals/Urinary Tract: Adrenal glands are unremarkable. Kidneys are normal, without renal calculi, focal lesion, or hydronephrosis. Bladder is unremarkable. Stomach/Bowel: Other than the hiatal hernia, the stomach is normal. Small bowel is normal as well. The colon and appendix are unremarkable. There is fecal loading throughout the colon however Vascular/Lymphatic: Atherosclerotic changes are seen in the non aneurysmal aorta, minimal. No adenopathy. Reproductive: Status post hysterectomy. No adnexal masses. Other: No abdominal wall hernia or abnormality. No abdominopelvic ascites. Musculoskeletal: No acute or significant osseous findings. IMPRESSION: 1. No cause for the patient's anemia identified. 2. Minimal atherosclerotic change in the abdominal aorta and branching vessels. 3. Moderate hiatal hernia. Electronically Signed   By: Dorise Bullion III M.D   On: 08/06/2016 10:23    HOSPITAL DIAGNOSES: Principal Problem:   Chest pain Active Problems:   Obesity, Class I, BMI 30.0-34.9 (see actual BMI)   Ductal carcinoma in situ (DCIS) of right breast   Symptomatic anemia   Fecal occult blood test positive   Situational depression   Anemia   IMPRESSION: 1. Chest pain, possibly related to anemia. 2. Tachycardia - may be related to anemia, anxiety or chest pain  RECOMMENDATION: 1. Symptoms occurred gradually at rest and  were not worse with exertion, fairly atypical for angina. EKG shows no acute ischemic changes. Troponins are negative. Could consider pericardial process, however, she denies positional chest pain only dizziness, which she relates to anemia. Symptoms improved today, despite only a mild increase in hemoglobin from 9-10. Plan for GI evaluation of hemoccult + stool. Would risk stratify with an echocardiogram. Don't see a clear indication for stress testing at this time. If echo is reassuring, then would proceed with GI work-up.  Husband wants the patient to see Dr. Johnsie Cancel (his cardiologist), if needed after discharge. I instructed them to contact him for follow-up.  Thanks for the consultation.  Time Spent Directly with Patient: 30 minutes  Pixie Casino, MD, [facility] Attending Cardiologist [place] 08/07/2016, 8:52 AM

## 2016-08-07 NOTE — Progress Notes (Signed)
[facility] Gastroenterology Progress Note  Rachel Edwards 66 y.o. [birth year]  CC:  Symptomatic anemia, occult blood positive stool   Subjective: No acute issues overnight. Chest pain improving. Denied any GI symptoms. Awaiting echo  ROS : Negative for abdominal pain, nausea and vomiting. Negative for active bleeding   Objective: Vital signs in last 24 hours: Vitals:   08/06/16 2109 08/07/16 0521  BP: 114/71 133/76  Pulse: 82 77  Resp: 20 13  Temp: 98.1 F (36.7 C) 98.1 F (36.7 C)    Physical Exam:  General:  Alert, cooperative, no distress, appears stated age  Head:  Normocephalic, without obvious abnormality, atraumatic  Eyes:  , EOM's intact,   Lungs:   Clear to auscultation bilaterally, respirations unlabored  Heart:  Regular rate and rhythm, S1, S2 normal  Abdomen:   Soft, non-tender, bowel sounds active all four quadrants,  no masses,   Extremities: Extremities normal, atraumatic, no  edema  Pulses: 2+ and symmetric    Lab Results:  Recent Labs  08/06/16 0705 08/06/16 1354  NA 139  --   K 3.8  --   CL 104  --   CO2 20*  --   GLUCOSE 122*  --   BUN 25*  --   CREATININE 0.97  --   CALCIUM 9.1  --   MG  --  2.0   No results for input(s): AST, ALT, ALKPHOS, BILITOT, PROT, ALBUMIN in the last 72 hours.  Recent Labs  08/06/16 0705 08/06/16 2224  WBC 10.7* 6.2  HGB 9.2* 10.5*  HCT 27.8* 32.2*  MCV 93.9 89.9  PLT 246 182   No results for input(s): LABPROT, INR in the last 72 hours.    Assessment/Plan: - Anemia with occult blood Positive stool. No overt bleeding. - Large hiatal hernia per CT scan. - Chest pain/tachycardia  Recommendations ------------------------- - We'll consider EGD tomorrow after echocardiogram if no acute cardiac issues identified - Recommend outpatient colonoscopy as last colonoscopy was 10 years ago - GI will follow.   Otis Brace MD, FACP 08/07/2016, 10:48 AM  Pager [phone]  If no answer or after 5 PM call  [phone]

## 2016-08-07 NOTE — Progress Notes (Signed)
PROGRESS NOTE    Rachel Edwards  GEX:[id number] DOB: 10-23-1950 DOA: 08/06/2016 PCP: Gara Kroner, MD   Outpatient Specialists:     Brief Narrative:  Rachel Edwards is a 66 y.o. female who presents with symptomatic anemia from gi bleed and chest pain. Appreciate GI assistance in management of this pt.   Assessment & Plan:   Principal Problem:   Chest pain Active Problems:   Obesity, Class I, BMI 30.0-34.9 (see actual BMI)   Ductal carcinoma in situ (DCIS) of right breast   Symptomatic anemia   Fecal occult blood test positive   Situational depression   Anemia   Chest pain/exertional tachycardia -Suspect symptoms driven by symptomatic anemia and occult GI blood loss -CE negative -Echo read pending -appreciate cardiology consult-- if echo ok then can proceed with EGD    Symptomatic anemia/  Fecal occult blood test positive -Baseline hemoglobin March 2017 was 12 -s/p 2 units PRBC -FOB positive and given known large hernia and recent reflux and indigestion symptoms as well as difficulty swallowing consider upper GI source -BUN also mildly elevated also suggestive of upper GI source -IV PPI and H2 blocker -Clear liquids only -Reports last colonoscopy greater than 10 years ago -[facility]: plan for EGD in AM and colonoscopy as an outpatient    Ductal carcinoma in situ (DCIS) of right breast -Continue tamoxifen    Obesity, Class I, BMI 30.0-34.9 (see actual BMI)    Situational depression -Continue preadmission medications      DVT prophylaxis:  SCD's  Code Status: Full Code   Family Communication: Multiple at bedside  Disposition Plan:     Consultants:   GI  cards   Subjective: No further bleeding  Objective: Vitals:   08/06/16 1818 08/06/16 2022 08/06/16 2109 08/07/16 0521  BP: 130/84 137/84 114/71 133/76  Pulse: 86 88 82 77  Resp: 20 18 20 13   Temp: 97.9 F (36.6 C) 98.2 F (36.8 C) 98.1 F (36.7 C) 98.1 F (36.7  C)  TempSrc: Oral Oral Oral Oral  SpO2: 98% 98% 97% 97%  Weight:    82.8 kg (182 lb 8 oz)  Height:        Intake/Output Summary (Last 24 hours) at 08/07/16 1219 Last data filed at 08/07/16 0102  Gross per 24 hour  Intake             2128 ml  Output              375 ml  Net             1753 ml   Filed Weights   08/06/16 0700 08/06/16 1335 08/07/16 0521  Weight: 74.8 kg (165 lb) 86.3 kg (190 lb 4.1 oz) 82.8 kg (182 lb 8 oz)    Examination:  General exam: Appears calm and comfortable  Respiratory system: Clear to auscultation. Respiratory effort normal. Cardiovascular system: S1 & S2 heard, RRR. No JVD, murmurs, rubs, gallops or clicks. No pedal edema. Gastrointestinal system: Abdomen is nondistended, soft and nontender. No organomegaly or masses felt. Normal bowel sounds heard. Central nervous system: Alert and oriented. No focal neurological deficits.     Data Reviewed: I have personally reviewed following labs and imaging studies  CBC:  Recent Labs Lab 08/06/16 0705 08/06/16 2224  WBC 10.7* 6.2  HGB 9.2* 10.5*  HCT 27.8* 32.2*  MCV 93.9 89.9  PLT 246 725   Basic Metabolic Panel:  Recent Labs Lab 08/06/16 0705 08/06/16 1354  NA 139  --  K 3.8  --   CL 104  --   CO2 20*  --   GLUCOSE 122*  --   BUN 25*  --   CREATININE 0.97  --   CALCIUM 9.1  --   MG  --  2.0   GFR: Estimated Creatinine Clearance: 61.4 mL/min (by C-G formula based on SCr of 0.97 mg/dL). Liver Function Tests: No results for input(s): AST, ALT, ALKPHOS, BILITOT, PROT, ALBUMIN in the last 168 hours. No results for input(s): LIPASE, AMYLASE in the last 168 hours. No results for input(s): AMMONIA in the last 168 hours. Coagulation Profile: No results for input(s): INR, PROTIME in the last 168 hours. Cardiac Enzymes:  Recent Labs Lab 08/06/16 1354 08/06/16 2224 08/07/16 0148  TROPONINI <0.03 <0.03 <0.03   BNP (last 3 results) No results for input(s): PROBNP in the last 8760  hours. HbA1C: No results for input(s): HGBA1C in the last 72 hours. CBG: No results for input(s): GLUCAP in the last 168 hours. Lipid Profile: No results for input(s): CHOL, HDL, LDLCALC, TRIG, CHOLHDL, LDLDIRECT in the last 72 hours. Thyroid Function Tests:  Recent Labs  08/06/16 0705  TSH 4.943*  FREET4 0.70   Anemia Panel:  Recent Labs  08/06/16 1100 08/06/16 1110  VITAMINB12  --  352  FOLATE 29.7  --   FERRITIN  --  34  TIBC  --  325  IRON  --  43  RETICCTPCT  --  2.0   Urine analysis: No results found for: COLORURINE, APPEARANCEUR, LABSPEC, PHURINE, GLUCOSEU, HGBUR, BILIRUBINUR, KETONESUR, PROTEINUR, UROBILINOGEN, NITRITE, LEUKOCYTESUR   )No results found for this or any previous visit (from the past 240 hour(s)).    Anti-infectives    None       Radiology Studies: Dg Chest 2 View  Result Date: 08/06/2016 CLINICAL DATA:  Chest tightness since yesterday. History of hypertension, hyperlipidemia, right breast malignancy EXAM: CHEST  2 VIEW COMPARISON:  None in PACs FINDINGS: The lungs are adequately inflated and clear. No pulmonary parenchymal nodules or masses are observed. There is no interstitial nor alveolar infiltrate. There is no pleural effusion or pneumothorax. There is mild biapical pleural thickening. The heart and pulmonary vascularity are normal. There is a moderate-sized hiatal hernia. The bony structures are unremarkable. There are surgical vascular clips in the right breast. IMPRESSION: There is no active cardiopulmonary disease. Moderate-sized hiatal hernia. Electronically Signed   By: David  Martinique M.D.   On: 08/06/2016 07:41   Ct Abdomen Pelvis W Contrast  Result Date: 08/06/2016 CLINICAL DATA:  The patient came to the emergency room with chest pain. The patient was found have a hemoglobin of 9.2. No abdominal pain. History of right breast cancer. EXAM: CT ABDOMEN AND PELVIS WITH CONTRAST TECHNIQUE: Multidetector CT imaging of the abdomen and pelvis  was performed using the standard protocol following bolus administration of intravenous contrast. CONTRAST:  1 ISOVUE-300 IOPAMIDOL (ISOVUE-300) INJECTION 61% COMPARISON:  None. FINDINGS: Lower chest: There is a moderate sized hiatal hernia. No nodules, masses, or focal infiltrates. Hepatobiliary: Hepatic steatosis is identified. The gallbladder is normal. The portal vein is normal. Pancreas: Unremarkable. No pancreatic ductal dilatation or surrounding inflammatory changes. Spleen: Normal in size without focal abnormality. Adrenals/Urinary Tract: Adrenal glands are unremarkable. Kidneys are normal, without renal calculi, focal lesion, or hydronephrosis. Bladder is unremarkable. Stomach/Bowel: Other than the hiatal hernia, the stomach is normal. Small bowel is normal as well. The colon and appendix are unremarkable. There is fecal loading throughout the colon however  Vascular/Lymphatic: Atherosclerotic changes are seen in the non aneurysmal aorta, minimal. No adenopathy. Reproductive: Status post hysterectomy. No adnexal masses. Other: No abdominal wall hernia or abnormality. No abdominopelvic ascites. Musculoskeletal: No acute or significant osseous findings. IMPRESSION: 1. No cause for the patient's anemia identified. 2. Minimal atherosclerotic change in the abdominal aorta and branching vessels. 3. Moderate hiatal hernia. Electronically Signed   By: Dorise Bullion III M.D   On: 08/06/2016 10:23        Scheduled Meds: . buPROPion  300 mg Oral Daily  . busPIRone  15 mg Oral BID  . famotidine (PEPCID) IV  20 mg Intravenous Q12H  . mirabegron ER  50 mg Oral Daily  . multivitamin with minerals  1 tablet Oral Daily  . pantoprazole (PROTONIX) IV  40 mg Intravenous Q12H  . sodium chloride flush  3 mL Intravenous Q12H  . tamoxifen  20 mg Oral Daily  . venlafaxine  37.5 mg Oral Daily   Continuous Infusions: . sodium chloride 75 mL/hr at 08/07/16 1112     LOS: 0 days    Time spent: 25  min    [place], DO [facility] Pager [phone]  If 7PM-7AM, please contact night-coverage [email or web address] Password [email or web address] 08/07/2016, 12:19 PM

## 2016-08-08 ENCOUNTER — Observation Stay (HOSPITAL_COMMUNITY): Payer: Medicare Other | Admitting: Anesthesiology

## 2016-08-08 ENCOUNTER — Encounter (HOSPITAL_COMMUNITY): Payer: Self-pay | Admitting: *Deleted

## 2016-08-08 ENCOUNTER — Encounter (HOSPITAL_COMMUNITY)
Admission: EM | Disposition: A | Discharge status: Home / Self Care | Payer: Self-pay | Source: Home / Self Care | Attending: Emergency Medicine

## 2016-08-08 DIAGNOSIS — D500 Iron deficiency anemia secondary to blood loss (chronic): Secondary | ICD-10-CM | POA: Diagnosis not present

## 2016-08-08 DIAGNOSIS — R195 Other fecal abnormalities: Secondary | ICD-10-CM | POA: Diagnosis not present

## 2016-08-08 DIAGNOSIS — I5189 Other ill-defined heart diseases: Secondary | ICD-10-CM

## 2016-08-08 DIAGNOSIS — R0789 Other chest pain: Secondary | ICD-10-CM | POA: Diagnosis not present

## 2016-08-08 DIAGNOSIS — K222 Esophageal obstruction: Secondary | ICD-10-CM | POA: Diagnosis not present

## 2016-08-08 DIAGNOSIS — R079 Chest pain, unspecified: Secondary | ICD-10-CM | POA: Diagnosis not present

## 2016-08-08 DIAGNOSIS — F4321 Adjustment disorder with depressed mood: Secondary | ICD-10-CM | POA: Diagnosis not present

## 2016-08-08 DIAGNOSIS — I77819 Aortic ectasia, unspecified site: Secondary | ICD-10-CM

## 2016-08-08 DIAGNOSIS — D649 Anemia, unspecified: Secondary | ICD-10-CM | POA: Diagnosis not present

## 2016-08-08 HISTORY — DX: Other ill-defined heart diseases: I51.89

## 2016-08-08 HISTORY — PX: ESOPHAGOGASTRODUODENOSCOPY (EGD) WITH PROPOFOL: SHX5813

## 2016-08-08 HISTORY — DX: Aortic ectasia, unspecified site: I77.819

## 2016-08-08 LAB — BASIC METABOLIC PANEL WITH GFR
Anion gap: 5 (ref 5–15)
BUN: 8 mg/dL (ref 6–20)
CO2: 25 mmol/L (ref 22–32)
Calcium: 8.6 mg/dL — ABNORMAL LOW (ref 8.9–10.3)
Chloride: 111 mmol/L (ref 101–111)
Creatinine, Ser: 0.89 mg/dL (ref 0.44–1.00)
GFR calc Af Amer: >60 mL/min
GFR calc non Af Amer: >60 mL/min
Glucose, Bld: 100 mg/dL — ABNORMAL HIGH (ref 65–99)
Potassium: 3.7 mmol/L (ref 3.5–5.1)
Sodium: 141 mmol/L (ref 135–145)

## 2016-08-08 LAB — CBC
HCT: 32 % — ABNORMAL LOW (ref 36.0–46.0)
Hemoglobin: 10.3 g/dL — ABNORMAL LOW (ref 12.0–15.0)
MCH: 29.2 pg (ref 26.0–34.0)
MCHC: 32.2 g/dL (ref 30.0–36.0)
MCV: 90.7 fL (ref 78.0–100.0)
Platelets: 206 10*3/uL (ref 150–400)
RBC: 3.53 MIL/uL — ABNORMAL LOW (ref 3.87–5.11)
RDW: 15.2 % (ref 11.5–15.5)
WBC: 6.1 10*3/uL (ref 4.0–10.5)

## 2016-08-08 SURGERY — ESOPHAGOGASTRODUODENOSCOPY (EGD) WITH PROPOFOL
Anesthesia: Monitor Anesthesia Care

## 2016-08-08 MED ORDER — NIFEREX PO TABS: 150.0000 mg | ORAL_TABLET | Freq: Every day | ORAL | 0 refills | Status: DC

## 2016-08-08 MED ORDER — PROPOFOL 10 MG/ML IV BOLUS
INTRAVENOUS | Status: DC | PRN
  Administered 2016-08-08 (×4): 20 mg via INTRAVENOUS

## 2016-08-08 MED ORDER — PROPOFOL 500 MG/50ML IV EMUL
INTRAVENOUS | Status: DC | PRN
  Administered 2016-08-08: 100 ug/kg/min via INTRAVENOUS

## 2016-08-08 MED ORDER — PANTOPRAZOLE SODIUM 40 MG PO TBEC: 40.0000 mg | DELAYED_RELEASE_TABLET | Freq: Every day | ORAL | 0 refills | Status: DC

## 2016-08-08 MED ORDER — PANTOPRAZOLE SODIUM 40 MG PO TBEC
40.0000 mg | DELAYED_RELEASE_TABLET | Freq: Every day | ORAL | Status: DC
  Administered 2016-08-08: 40 mg via ORAL
  Filled 2016-08-08: qty 1

## 2016-08-08 MED ORDER — LIDOCAINE HCL (CARDIAC) 20 MG/ML IV SOLN
INTRAVENOUS | Status: DC | PRN
  Administered 2016-08-08: 100 mg via INTRATRACHEAL

## 2016-08-08 SURGICAL SUPPLY — 15 items

## 2016-08-08 NOTE — Discharge Instructions (Signed)
Anemia, Nonspecific Anemia is a condition in which the concentration of red blood cells or hemoglobin in the blood is below normal. Hemoglobin is a substance in red blood cells that carries oxygen to the tissues of the body. Anemia results in not enough oxygen reaching these tissues. What are the causes? Common causes of anemia include:  Excessive bleeding. Bleeding may be internal or external. This includes excessive bleeding from periods (in women) or from the intestine.  Poor nutrition.  Chronic kidney, thyroid, and liver disease.  Bone marrow disorders that decrease red blood cell production.  Cancer and treatments for cancer.  HIV, AIDS, and their treatments.  Spleen problems that increase red blood cell destruction.  Blood disorders.  Excess destruction of red blood cells due to infection, medicines, and autoimmune disorders. What are the signs or symptoms?  Minor weakness.  Dizziness.  Headache.  Palpitations.  Shortness of breath, especially with exercise.  Paleness.  Cold sensitivity.  Indigestion.  Nausea.  Difficulty sleeping.  Difficulty concentrating. Symptoms may occur suddenly or they may develop slowly. How is this diagnosed? Additional blood tests are often needed. These help your health care provider determine the best treatment. Your health care provider will check your stool for blood and look for other causes of blood loss. How is this treated? Treatment varies depending on the cause of the anemia. Treatment can include:  Supplements of iron, vitamin Q22, or folic acid.  Hormone medicines.  A blood transfusion. This may be needed if blood loss is severe.  Hospitalization. This may be needed if there is significant continual blood loss.  Dietary changes.  Spleen removal. Follow these instructions at home: Keep all follow-up appointments. It often takes many weeks to correct anemia, and having your health care provider check on your  condition and your response to treatment is very important. Get help right away if:  You develop extreme weakness, shortness of breath, or chest pain.  You become dizzy or have trouble concentrating.  You develop heavy vaginal bleeding.  You develop a rash.  You have bloody or black, tarry stools.  You faint.  You vomit up blood.  You vomit repeatedly.  You have abdominal pain.  You have a fever or persistent symptoms for more than 2-3 days.  You have a fever and your symptoms suddenly get worse.  You are dehydrated. This information is not intended to replace advice given to you by your health care provider. Make sure you discuss any questions you have with your health care provider. Document Released: 06/21/2004 Document Revised: 10/26/2015 Document Reviewed: 11/07/2012 Elsevier Interactive Patient Education  2017 [employer]   Nonspecific Chest Pain Chest pain can be caused by many different conditions. There is always a chance that your pain could be related to something serious, such as a heart attack or a blood clot in your lungs. Chest pain can also be caused by conditions that are not life-threatening. If you have chest pain, it is very important to follow up with your health care provider. What are the causes? Causes of this condition include:  Heartburn.  Pneumonia or bronchitis.  Anxiety or stress.  Inflammation around your heart (pericarditis) or lung (pleuritis or pleurisy).  A blood clot in your lung.  A collapsed lung (pneumothorax). This can develop suddenly on its own (spontaneous pneumothorax) or from trauma to the chest.  Shingles infection (varicella-zoster virus).  Heart attack.  Damage to the bones, muscles, and cartilage that make up your chest wall. This can  include:  Bruised bones due to injury.  Strained muscles or cartilage due to frequent or repeated coughing or overwork.  Fracture to one or more ribs.  Sore cartilage due to  inflammation (costochondritis). What increases the risk? Risk factors for this condition may include:  Activities that increase your risk for trauma or injury to your chest.  Respiratory infections or conditions that cause frequent coughing.  Medical conditions or overeating that can cause heartburn.  Heart disease or family history of heart disease.  Conditions or health behaviors that increase your risk of developing a blood clot.  Having had chicken pox (varicella zoster). What are the signs or symptoms? Chest pain can feel like:  Burning or tingling on the surface of your chest or deep in your chest.  Crushing, pressure, aching, or squeezing pain.  Dull or sharp pain that is worse when you move, cough, or take a deep breath.  Pain that is also felt in your back, neck, shoulder, or arm, or pain that spreads to any of these areas. Your chest pain may come and go, or it may stay constant. How is this diagnosed? Lab tests or other studies may be needed to find the cause of your pain. Your health care provider may have you take a test called an ECG (electrocardiogram). An ECG records your heartbeat patterns at the time the test is performed. You may also have other tests, such as:  Transthoracic echocardiogram (TTE). In this test, sound waves are used to create a picture of the heart structures and to look at how blood flows through your heart.  Transesophageal echocardiogram (TEE).This is a more advanced imaging test that takes images from inside your body. It allows your health care provider to see your heart in finer detail.  Cardiac monitoring. This allows your health care provider to monitor your heart rate and rhythm in real time.  Holter monitor. This is a portable device that records your heartbeat and can help to diagnose abnormal heartbeats. It allows your health care provider to track your heart activity for several days, if needed.  Stress tests. These can be done  through exercise or by taking medicine that makes your heart beat more quickly.  Blood tests.  Other imaging tests. How is this treated? Treatment depends on what is causing your chest pain. Treatment may include:  Medicines. These may include:  Acid blockers for heartburn.  Anti-inflammatory medicine.  Pain medicine for inflammatory conditions.  Antibiotic medicine, if an infection is present.  Medicines to dissolve blood clots.  Medicines to treat coronary artery disease (CAD).  Supportive care for conditions that do not require medicines. This may include:  Resting.  Applying heat or cold packs to injured areas.  Limiting activities until pain decreases. Follow these instructions at home: Medicines   If you were prescribed an antibiotic, take it as told by your health care provider. Do not stop taking the antibiotic even if you start to feel better.  Take over-the-counter and prescription medicines only as told by your health care provider. Lifestyle   Do not use any products that contain nicotine or tobacco, such as cigarettes and e-cigarettes. If you need help quitting, ask your health care provider.  Do not drink alcohol.  Make lifestyle changes as directed by your health care provider. These may include:  Getting regular exercise. Ask your health care provider to suggest some activities that are safe for you.  Eating a heart-healthy diet. A registered dietitian can help you to  learn healthy eating options.  Maintaining a healthy weight.  Managing diabetes, if necessary.  Reducing stress, such as with yoga or relaxation techniques. General instructions   Avoid any activities that bring on chest pain.  If heartburn is the cause for your chest pain, raise (elevate) the head of your bed about 6 inches (15 cm) by putting blocks under the legs. Sleeping with more pillows does not effectively relieve heartburn because it only changes the position of your  head.  Keep all follow-up visits as told by your health care provider. This is important. This includes any further testing if your chest pain does not go away. Contact a health care provider if:  Your chest pain does not go away.  You have a rash with blisters on your chest.  You have a fever.  You have chills. Get help right away if:  Your chest pain is worse.  You have a cough that gets worse, or you cough up blood.  You have severe pain in your abdomen.  You have severe weakness.  You faint.  You have sudden, unexplained chest discomfort.  You have sudden, unexplained discomfort in your arms, back, neck, or jaw.  You have shortness of breath at any time.  You suddenly start to sweat, or your skin gets clammy.  You feel nauseous or you vomit.  You suddenly feel light-headed or dizzy.  Your heart begins to beat quickly, or it feels like it is skipping beats. These symptoms may represent a serious problem that is an emergency. Do not wait to see if the symptoms will go away. Get medical help right away. Call your local emergency services (911 in the [place] Do not drive yourself to the hospital. This information is not intended to replace advice given to you by your health care provider. Make sure you discuss any questions you have with your health care provider. Document Released: 02/21/2005 Document Revised: 02/06/2016 Document Reviewed: 02/06/2016 Elsevier Interactive Patient Education  2017 [employer].

## 2016-08-08 NOTE — Transfer of Care (Signed)
Immediate Anesthesia Transfer of Care Note  Patient: Rachel Edwards  Procedure(s) Performed: Procedure(s): ESOPHAGOGASTRODUODENOSCOPY (EGD) WITH PROPOFOL (N/A)  Patient Location: Endoscopy Unit  Anesthesia Type:MAC  Level of Consciousness: awake, alert  and oriented  Airway & Oxygen Therapy: Patient Spontanous Breathing and Patient connected to nasal cannula oxygen  Post-op Assessment: Report given to RN, Post -op Vital signs reviewed and stable and Patient moving all extremities X 4  Post vital signs: Reviewed and stable  Last Vitals:  Vitals:   08/08/16 0527 08/08/16 0849  BP: (!) 144/83 (!) 156/90  Pulse: 86   Resp:  18  Temp: 36.7 C 36.7 C    Last Pain:  Vitals:   08/08/16 0849  TempSrc: Oral  PainSc:          Complications: No apparent anesthesia complications

## 2016-08-08 NOTE — Progress Notes (Signed)
Progress Note  Patient Name: Rachel Edwards Date of Encounter: 08/08/2016   Primary Cardiologist: Dr. Debara Pickett saw patient this admission,   Subjective   Feeling somewhat tired but also feels relieved there is an answer (EGD as below). No CP reported. Breathing stable.  Inpatient Medications    Scheduled Meds: . buPROPion  300 mg Oral Daily  . busPIRone  15 mg Oral BID  . famotidine (PEPCID) IV  20 mg Intravenous Q12H  . loratadine  10 mg Oral Daily  . mirabegron ER  50 mg Oral Daily  . multivitamin with minerals  1 tablet Oral Daily  . pantoprazole  40 mg Oral Daily  . sodium chloride flush  3 mL Intravenous Q12H  . tamoxifen  20 mg Oral Daily  . venlafaxine  37.5 mg Oral Daily   Continuous Infusions: . sodium chloride 75 mL/hr at 08/08/16 0323   PRN Meds: acetaminophen **OR** acetaminophen, diazepam, ondansetron **OR** ondansetron (ZOFRAN) IV   Vital Signs    Vitals:   08/08/16 0527 08/08/16 0849 08/08/16 1005 08/08/16 1015  BP: (!) 144/83 (!) 156/90 140/90 130/88  Pulse: 86   84  Resp:  18 19 16   Temp: 98 F (36.7 C) 98 F (36.7 C) 97.9 F (36.6 C)   TempSrc: Oral Oral Oral   SpO2: 98% 96% 95% 95%  Weight: 181 lb 11.2 oz (82.4 kg)     Height:        Intake/Output Summary (Last 24 hours) at 08/08/16 1126 Last data filed at 08/08/16 1005  Gross per 24 hour  Intake             1510 ml  Output                0 ml  Net             1510 ml   Filed Weights   08/06/16 1335 08/07/16 0521 08/08/16 0527  Weight: 190 lb 4.1 oz (86.3 kg) 182 lb 8 oz (82.8 kg) 181 lb 11.2 oz (82.4 kg)    Telemetry    NSR - Personally Reviewed  Physical Exam   GEN: Female, No acute distress. Somewhat pale appearing.  HEENT: Normocephalic, atraumatic, sclera non-icteric. Neck: No JVD or bruits. Cardiac: RRR no murmurs, rubs, or gallops.  Radials/DP/PT 1+ and equal bilaterally.  Respiratory: Clear to auscultation bilaterally. Breathing is unlabored. GI: Soft, nontender,  non-distended, BS +x 4. MS: no deformity. Extremities: No clubbing or cyanosis. No edema. Distal pedal pulses are 2+ and equal bilaterally. Neuro:  AAOx3. Follows commands. Psych:  Responds to questions appropriately with a normal affect.  Labs    Chemistry Recent Labs Lab 08/06/16 0705 08/08/16 0319  NA 139 141  K 3.8 3.7  CL 104 111  CO2 20* 25  GLUCOSE 122* 100*  BUN 25* 8  CREATININE 0.97 0.89  CALCIUM 9.1 8.6*  GFRNONAA 60* >60  GFRAA >60 >60  ANIONGAP 15 5     Hematology Recent Labs Lab 08/06/16 0705 08/06/16 1110 08/06/16 2224 08/08/16 0319  WBC 10.7*  --  6.2 6.1  RBC 2.96* 2.55* 3.58* 3.53*  HGB 9.2*  --  10.5* 10.3*  HCT 27.8*  --  32.2* 32.0*  MCV 93.9  --  89.9 90.7  MCH 31.1  --  29.3 29.2  MCHC 33.1  --  32.6 32.2  RDW 12.9  --  15.1 15.2  PLT 246  --  182 206    Cardiac Enzymes Recent  Labs Lab 08/06/16 1354 08/06/16 2224 08/07/16 0148  TROPONINI <0.03 <0.03 <0.03    Recent Labs Lab 08/06/16 0714  TROPIPOC 0.00     BNPNo results for input(s): BNP, PROBNP in the last 168 hours.   DDimer No results for input(s): DDIMER in the last 168 hours.   Radiology    No results found.  Cardiac Studies   2D echo 08/07/16 - Left ventricle: The cavity size was normal. Wall thickness was   normal. Systolic function was normal. The estimated ejection   fraction was in the range of 55% to 60%. Wall motion was normal;   there were no regional wall motion abnormalities. Doppler   parameters are consistent with abnormal left ventricular   relaxation (grade 1 diastolic dysfunction). - Ascending aorta: The ascending aorta was mildly dilated. Impressions: - Normal LV systoilc function; grade 1 diastolic dysfunction;   mildly dilated ascending aorta.  Patient Profile     66 y.o. 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 Cardiology asked to see for chest tightness.  Assessment & Plan    1. Chest pain/tachycardia - atypical for angina, likely suspect related to anemia, gastritis, and ulcers seen by EGD. Also has large hiatal hernia. LV function normal without RWMA. No further cardiac workup indicated at this time.   2. Blood loss anemia - per IM. Pt inquiring about long term iron, will defer to primary team. Per GI note, avoid NSAIDS for now which likely includes aspirin for the time being. Can f/u GI to determine when safe to resume.  3. Diastolic dysfunction without heart failure - reviewed importance of BP control, low sodium diet, avoiding excess fluid intake and observation of HF sx. Suspect related to BP. Once anemia has been deemed stable, would recommend strict BP control.  4. Mildly dilated ascending aorta - will review recommendations for surveillance with MD.   Patient and her husband would prefer following up with PCP long term instead of cardiology for now if appropriate.  Signed, Charlie Pitter, PA-C  08/08/2016, 11:26 AM

## 2016-08-08 NOTE — Anesthesia Preprocedure Evaluation (Addendum)
Anesthesia Evaluation  Patient identified by MRN, date of birth, ID band Patient awake    Reviewed: Allergy & Precautions, NPO status , Patient's Chart, lab work & pertinent test results  Airway Mallampati: II  TM Distance: >3 FB Neck ROM: Full    Dental   Pulmonary neg pulmonary ROS,    breath sounds clear to auscultation       Cardiovascular hypertension,  Rhythm:Regular Rate:Normal     Neuro/Psych Anxiety Depression negative neurological ROS     GI/Hepatic Neg liver ROS, hiatal hernia,   Endo/Other  negative endocrine ROS  Renal/GU negative Renal ROS     Musculoskeletal   Abdominal   Peds  Hematology  (+) anemia ,   Anesthesia Other Findings   Reproductive/Obstetrics                            Lab Results  Component Value Date   WBC 6.1 08/08/2016   HGB 10.3 (L) 08/08/2016   HCT 32.0 (L) 08/08/2016   MCV 90.7 08/08/2016   PLT 206 08/08/2016   Lab Results  Component Value Date   CREATININE 0.89 08/08/2016   BUN 8 08/08/2016   NA 141 08/08/2016   K 3.7 08/08/2016   CL 111 08/08/2016   CO2 25 08/08/2016    Anesthesia Physical Anesthesia Plan  ASA: III  Anesthesia Plan: MAC   Post-op Pain Management:    Induction: Intravenous  Airway Management Planned: Natural Airway and Nasal Cannula  Additional Equipment:   Intra-op Plan:   Post-operative Plan:   Informed Consent: I have reviewed the patients History and Physical, chart, labs and discussed the procedure including the risks, benefits and alternatives for the proposed anesthesia with the patient or authorized representative who has indicated his/her understanding and acceptance.     Plan Discussed with:   Anesthesia Plan Comments:         Anesthesia Quick Evaluation

## 2016-08-08 NOTE — Anesthesia Postprocedure Evaluation (Signed)
Anesthesia Post Note  Patient: LETANYA FROH  Procedure(s) Performed: Procedure(s) (LRB): ESOPHAGOGASTRODUODENOSCOPY (EGD) WITH PROPOFOL (N/A)  Patient location during evaluation: PACU Anesthesia Type: MAC Level of consciousness: awake and alert Pain management: pain level controlled Vital Signs Assessment: post-procedure vital signs reviewed and stable Respiratory status: spontaneous breathing, nonlabored ventilation and respiratory function stable Cardiovascular status: stable and blood pressure returned to baseline Anesthetic complications: no       Last Vitals:  Vitals:   08/08/16 1005 08/08/16 1015  BP: 140/90 130/88  Pulse:  84  Resp: 19 16  Temp: 36.6 C     Last Pain:  Vitals:   08/08/16 1005  TempSrc: Oral  PainSc:                  Deanndra Kirley,W. EDMOND

## 2016-08-08 NOTE — Plan of Care (Addendum)
Problem: Safety: Goal: Ability to remain free from injury will improve Outcome: Progressing Fall risk bundle in place. Call light within reach and pt demonstrated use. Pt stated she would use call light before getting out of bed in order for [place] to disconnect her from scd's and assist. Will continue to monitor pt this shift and perform hourly rounding.

## 2016-08-08 NOTE — Op Note (Signed)
[facility] Patient Name: Rachel Edwards Procedure Date : 08/08/2016 MRN: [id number] Attending MD: Otis Brace , MD Date of Birth: Jan 31, 1951 CSN: [id number] Age: 66 Admit Type: Inpatient Procedure:                Upper GI endoscopy Indications:              Iron deficiency anemia secondary to chronic blood                            loss, Occult blood in stool Providers:                Otis Brace, MD, Cleda Daub, RN, Corliss Parish, Technician Referring MD:              Medicines:                Sedation Administered by an Anesthesia Professional Complications:            No immediate complications. Estimated Blood Loss:     Estimated blood loss was minimal. Procedure:                Pre-Anesthesia Assessment:                           - Prior to the procedure, a History and Physical                            was performed, and patient medications and                            allergies were reviewed. The patient's tolerance of                            previous anesthesia was also reviewed. The risks                            and benefits of the procedure and the sedation                            options and risks were discussed with the patient.                            All questions were answered, and informed consent                            was obtained. Prior Anticoagulants: The patient has                            taken no previous anticoagulant or antiplatelet                            agents. ASA Grade Assessment: III - A patient with  severe systemic disease. After reviewing the risks                            and benefits, the patient was deemed in                            satisfactory condition to undergo the procedure.                           After obtaining informed consent, the endoscope was                            passed under direct vision. Throughout the                     procedure, the patient's blood pressure, pulse, and                            oxygen saturations were monitored continuously. The                            [id number] ([id number]) scope was introduced through the                            mouth, and advanced to the second part of duodenum.                            The patient tolerated the procedure well. Scope In: Scope Out: Findings:      A non-obstructing Schatzki ring (acquired) was found at the       gastroesophageal junction.      Normal mucosa was found in the entire esophagus.      A 5 cm hiatal hernia with a few [name] ulcers was found. The proximal       extent of the gastric folds (end of tubular esophagus) was 35 cm from       the incisors. The hiatal narrowing was 40 cm from the incisors.      Scattered mild inflammation characterized by congestion (edema),       erythema and friability was found in the prepyloric region of the       stomach. Biopsies were taken with a cold forceps for Helicobacter pylori       testing.      retroflexion showed large hiatal hernia with the clean-based [name]'s       ulcers in the hernia sac.      The duodenal bulb, first portion of the duodenum and second portion of       the duodenum were normal. Biopsies for histology were taken with a cold       forceps for evaluation of celiac disease. Impression:               - Non-obstructing Schatzki ring.                           - Normal mucosa was found in the entire esophagus.                           -  5 cm hiatal hernia with a few [name] ulcers.                           - Gastritis. Biopsied.                           - Normal duodenal bulb, first portion of the                            duodenum and second portion of the duodenum.                            Biopsied. Moderate Sedation:      Moderate (conscious) sedation was personally administered by an       anesthesia professional. The following parameters were  monitored: oxygen       saturation, heart rate, blood pressure, and response to care. Recommendation:           - Return patient to hospital ward for ongoing care.                           - Resume previous diet.                           - Continue present medications.                           - Await pathology results.                           - Return to my office in 2 weeks. Procedure Code(s):        --- Professional ---                           [id number], Esophagogastroduodenoscopy, flexible,                            transoral; with biopsy, single or multiple Diagnosis Code(s):        --- Professional ---                           K22.2, Esophageal obstruction                           K44.9, Diaphragmatic hernia without obstruction or                            gangrene                           K25.9, Gastric ulcer, unspecified as acute or                            chronic, without hemorrhage or perforation                           K29.70, Gastritis, unspecified, without bleeding  D50.0, Iron deficiency anemia secondary to blood                            loss (chronic)                           R19.5, Other fecal abnormalities CPT copyright 2016 [employer]. All rights reserved. The codes documented in this report are preliminary and upon coder review may  be revised to meet current compliance requirements. Otis Brace, MD Otis Brace, MD 08/08/2016 10:05:41 AM Number of Addenda: 0

## 2016-08-08 NOTE — Discharge Summary (Signed)
Physician Discharge Summary  Rachel Edwards KZS:[id number] DOB: Sep 09, 1950 DOA: 08/06/2016  PCP: Gara Kroner, MD  Admit date: 08/06/2016 Discharge date: 08/08/2016   Recommendations for Outpatient Follow-Up:   1. Avoid NSAIDS 2. Outpatient referral to general surgery for large hiatal hernia 3.  dilated ascending aorta to 4.1 cm.  follow-up CT aortogram in 1 year   Discharge Diagnosis:   Principal Problem:   Chest pain Active Problems:   Obesity, Class I, BMI 30.0-34.9 (see actual BMI)   Ductal carcinoma in situ (DCIS) of right breast   Symptomatic anemia   Fecal occult blood test positive   Situational depression   Anemia   Diastolic dysfunction without heart failure   Dilatation of aorta [facility])   Discharge disposition:  Home.  Discharge Condition: Improved.  Diet recommendation: Low sodium, heart healthy  Wound care: None.   History of Present Illness:   Rachel Edwards is a 66 y.o. female who presents with symptomatic anemia from gi bleed and chest pain. Appreciate GI assistance in management of this pt.     Hospital Course by Problem:   Chest pain/exertional tachycardia -Suspect symptoms driven by symptomatic anemia and occult GI blood loss -CE negative -Echo: Normal LV systoilc function; grade 1 diastolic dysfunction;   mildly dilated ascending aorta.-- outpatient follow up with CT  Symptomatic anemia/Fecal occult blood test positive -s/p 2 units PRBC -PO Fe to replete stores -s/p EGD:  Non-obstructing Schatzki ring.                           - Normal mucosa was found in the entire esophagus.                           - 5 cm hiatal hernia with a few [name] ulcers.                           - Gastritis. Biopsied.                           - Normal duodenal bulb, first portion of the duodenum and second portion of the duodenum. Biopsied.  Ductal carcinoma in situ (DCIS) of right breast -Continue tamoxifen  Obesity, Class I, BMI  30.0-34.9 (see actual BMI)  Situational depression -Continue preadmission medications     Medical Consultants:    Cards  GI   Discharge Exam:   Vitals:   08/08/16 1005 08/08/16 1015  BP: 140/90 130/88  Pulse:  84  Resp: 19 16  Temp: 97.9 F (36.6 C)    Vitals:   08/08/16 0527 08/08/16 0849 08/08/16 1005 08/08/16 1015  BP: (!) 144/83 (!) 156/90 140/90 130/88  Pulse: 86   84  Resp:  18 19 16   Temp: 98 F (36.7 C) 98 F (36.7 C) 97.9 F (36.6 C)   TempSrc: Oral Oral Oral   SpO2: 98% 96% 95% 95%  Weight: 82.4 kg (181 lb 11.2 oz)     Height:        Gen:  NAD    The results of significant diagnostics from this hospitalization (including imaging, microbiology, ancillary and laboratory) are listed below for reference.     Procedures and Diagnostic Studies:   Dg Chest 2 View  Result Date: 08/06/2016 CLINICAL DATA:  Chest tightness since yesterday. History of hypertension, hyperlipidemia, right breast malignancy  EXAM: CHEST  2 VIEW COMPARISON:  None in PACs FINDINGS: The lungs are adequately inflated and clear. No pulmonary parenchymal nodules or masses are observed. There is no interstitial nor alveolar infiltrate. There is no pleural effusion or pneumothorax. There is mild biapical pleural thickening. The heart and pulmonary vascularity are normal. There is a moderate-sized hiatal hernia. The bony structures are unremarkable. There are surgical vascular clips in the right breast. IMPRESSION: There is no active cardiopulmonary disease. Moderate-sized hiatal hernia. Electronically Signed   By: David  Martinique M.D.   On: 08/06/2016 07:41   Ct Abdomen Pelvis W Contrast  Result Date: 08/06/2016 CLINICAL DATA:  The patient came to the emergency room with chest pain. The patient was found have a hemoglobin of 9.2. No abdominal pain. History of right breast cancer. EXAM: CT ABDOMEN AND PELVIS WITH CONTRAST TECHNIQUE: Multidetector CT imaging of the abdomen and pelvis was  performed using the standard protocol following bolus administration of intravenous contrast. CONTRAST:  1 ISOVUE-300 IOPAMIDOL (ISOVUE-300) INJECTION 61% COMPARISON:  None. FINDINGS: Lower chest: There is a moderate sized hiatal hernia. No nodules, masses, or focal infiltrates. Hepatobiliary: Hepatic steatosis is identified. The gallbladder is normal. The portal vein is normal. Pancreas: Unremarkable. No pancreatic ductal dilatation or surrounding inflammatory changes. Spleen: Normal in size without focal abnormality. Adrenals/Urinary Tract: Adrenal glands are unremarkable. Kidneys are normal, without renal calculi, focal lesion, or hydronephrosis. Bladder is unremarkable. Stomach/Bowel: Other than the hiatal hernia, the stomach is normal. Small bowel is normal as well. The colon and appendix are unremarkable. There is fecal loading throughout the colon however Vascular/Lymphatic: Atherosclerotic changes are seen in the non aneurysmal aorta, minimal. No adenopathy. Reproductive: Status post hysterectomy. No adnexal masses. Other: No abdominal wall hernia or abnormality. No abdominopelvic ascites. Musculoskeletal: No acute or significant osseous findings. IMPRESSION: 1. No cause for the patient's anemia identified. 2. Minimal atherosclerotic change in the abdominal aorta and branching vessels. 3. Moderate hiatal hernia. Electronically Signed   By: Dorise Bullion III M.D   On: 08/06/2016 10:23     Labs:   Basic Metabolic Panel:  Recent Labs Lab 08/06/16 0705 08/06/16 1354 08/08/16 0319  NA 139  --  141  K 3.8  --  3.7  CL 104  --  111  CO2 20*  --  25  GLUCOSE 122*  --  100*  BUN 25*  --  8  CREATININE 0.97  --  0.89  CALCIUM 9.1  --  8.6*  MG  --  2.0  --    GFR Estimated Creatinine Clearance: 66.9 mL/min (by C-G formula based on SCr of 0.89 mg/dL). Liver Function Tests: No results for input(s): AST, ALT, ALKPHOS, BILITOT, PROT, ALBUMIN in the last 168 hours. No results for input(s):  LIPASE, AMYLASE in the last 168 hours. No results for input(s): AMMONIA in the last 168 hours. Coagulation profile No results for input(s): INR, PROTIME in the last 168 hours.  CBC:  Recent Labs Lab 08/06/16 0705 08/06/16 2224 08/08/16 0319  WBC 10.7* 6.2 6.1  HGB 9.2* 10.5* 10.3*  HCT 27.8* 32.2* 32.0*  MCV 93.9 89.9 90.7  PLT 246 182 206   Cardiac Enzymes:  Recent Labs Lab 08/06/16 1354 08/06/16 2224 08/07/16 0148  TROPONINI <0.03 <0.03 <0.03   BNP: Invalid input(s): POCBNP CBG: No results for input(s): GLUCAP in the last 168 hours. D-Dimer No results for input(s): DDIMER in the last 72 hours. Hgb A1c No results for input(s): HGBA1C in the last 72  hours. Lipid Profile No results for input(s): CHOL, HDL, LDLCALC, TRIG, CHOLHDL, LDLDIRECT in the last 72 hours. Thyroid function studies  Recent Labs  08/06/16 0705  TSH 4.943*   Anemia work up  Recent Labs  08/06/16 1100 08/06/16 1110  VITAMINB12  --  352  FOLATE 29.7  --   FERRITIN  --  34  TIBC  --  325  IRON  --  43  RETICCTPCT  --  2.0   Microbiology No results found for this or any previous visit (from the past 240 hour(s)).   Discharge Instructions:   Discharge Instructions    Diet - low sodium heart healthy    Complete by:  As directed    Increase activity slowly    Complete by:  As directed      Allergies as of 08/08/2016   No Known Allergies     Medication List    STOP taking these medications   ranitidine 150 MG tablet Commonly known as:  ZANTAC     TAKE these medications   aspirin 81 MG tablet Take 81 mg by mouth daily.   buPROPion 300 MG 24 hr tablet Commonly known as:  WELLBUTRIN XL Take 300 mg by mouth daily.   busPIRone 15 MG tablet Commonly known as:  BUSPAR Take 15 mg by mouth 2 (two) times daily.   calcium carbonate 600 MG Tabs tablet Commonly known as:  OS-CAL Take 600 mg by mouth daily with breakfast.   cetirizine 10 MG tablet Commonly known as:   ZYRTEC Take 10 mg by mouth daily.   CRESTOR 10 MG tablet Generic drug:  rosuvastatin Take 10 mg by mouth daily.   diazepam 10 MG tablet Commonly known as:  VALIUM Take 10 mg by mouth every 6 (six) hours as needed for anxiety.   lisinopril 5 MG tablet Commonly known as:  PRINIVIL,ZESTRIL Take 5 mg by mouth daily.   multivitamin with minerals Tabs tablet Take 1 tablet by mouth daily.   MYRBETRIQ 50 MG Tb24 tablet Generic drug:  mirabegron ER Take 50 mg by mouth daily.   NIFEREX Tabs Take 150 mg by mouth daily.   pantoprazole 40 MG tablet Commonly known as:  PROTONIX Take 1 tablet (40 mg total) by mouth daily. Start taking on:  08/09/2016   PROBIOTIC PO Take 1 tablet by mouth daily.   tamoxifen 20 MG tablet Commonly known as:  NOLVADEX Take 1 tablet (20 mg total) by mouth daily.   venlafaxine 37.5 MG tablet Commonly known as:  EFFEXOR Take 37.5 mg by mouth daily.   vitamin E 400 UNIT capsule Take 400 Units by mouth daily.      Follow-up Information    Gara Kroner, MD Follow up.   Specialty:  Family Medicine Why:  outpatient referral to surgery for large hiatal hernia Contact information: [place] [address] [phone]            Time coordinating discharge: 35 min  Signed:  [place]   [facility] 08/08/2016, 11:37 AM

## 2016-08-08 NOTE — Brief Op Note (Signed)
08/06/2016 - 08/08/2016  10:07 AM  PATIENT:  Rachel Edwards  66 y.o. female  PRE-OPERATIVE DIAGNOSIS:  Anemia. Occult blood positive stool  POST-OPERATIVE DIAGNOSIS:  cameron ulcers/hiatel hernia  PROCEDURE:  Procedure(s): ESOPHAGOGASTRODUODENOSCOPY (EGD) WITH PROPOFOL (N/A)  SURGEON:  Surgeon(s) and Role:    * Otis Brace, MD - Primary  Recommendations ------------------------- - EGD showed large hiatal hernia with the clean base [name] ulcers. Most likely source for patient's anemia. - No evidence of active bleeding. - Recommend once a day PPI. Avoid NSAIDs. - Recommend outpatient surgical evaluation for large hiatal hernia. - Advance diet. - Okay to discharge from GI standpoint. - Follow up in GI clinic in 2 weeks - GI will sign off. Call us back if needed. Thank you for consultation

## 2016-08-08 NOTE — Progress Notes (Signed)
[facility] Gastroenterology Progress Note  Rachel Edwards 66 y.o. 1951/04/13  CC:  Symptomatic anemia, occult blood positive stool   Subjective: No acute issues overnight. Chest pain resolved Denied any GI symptoms. Echo yesterday showed normal ejection fraction.  ROS : Negative for abdominal pain, nausea and vomiting. Negative for active bleeding   Objective: Vital signs in last 24 hours: Vitals:   08/08/16 0527 08/08/16 0849  BP: (!) 144/83 (!) 156/90  Pulse: 86   Resp:  18  Temp: 98 F (36.7 C) 98 F (36.7 C)    Physical Exam:  General:  Alert, cooperative, no distress, appears stated age  Head:  Normocephalic, without obvious abnormality, atraumatic  Eyes:  , EOM's intact,   Lungs:   Clear to auscultation bilaterally, respirations unlabored  Heart:  Regular rate and rhythm, S1, S2 normal  Abdomen:   Soft, non-tender, bowel sounds active all four quadrants,  no masses,   Extremities: Extremities normal, atraumatic, no  edema  Pulses: 2+ and symmetric    Lab Results:  Recent Labs  08/06/16 0705 08/06/16 1354 08/08/16 0319  NA 139  --  141  K 3.8  --  3.7  CL 104  --  111  CO2 20*  --  25  GLUCOSE 122*  --  100*  BUN 25*  --  8  CREATININE 0.97  --  0.89  CALCIUM 9.1  --  8.6*  MG  --  2.0  --    No results for input(s): AST, ALT, ALKPHOS, BILITOT, PROT, ALBUMIN in the last 72 hours.  Recent Labs  08/06/16 2224 08/08/16 0319  WBC 6.2 6.1  HGB 10.5* 10.3*  HCT 32.2* 32.0*  MCV 89.9 90.7  PLT 182 206   No results for input(s): LABPROT, INR in the last 72 hours.    Assessment/Plan: - Anemia with occult blood Positive stool. No overt bleeding.Hemoglobin stable - Large hiatal hernia per CT scan. - Chest pain - resolved. Negative troponins. Normal echo.  Recommendations ------------------------- - EGD today. Risk benefits alternatives discussed with the patient. Verbalized understanding. - Recommend outpatient colonoscopy as last colonoscopy was  10 years ago - Further plan based on endoscopic finding.   Otis Brace MD, [place] 08/08/2016, 9:38 AM  Pager [phone]  If no answer or after 5 PM call [phone]

## 2016-09-25 ENCOUNTER — Telehealth: Payer: Self-pay | Admitting: *Deleted

## 2016-09-25 NOTE — Telephone Encounter (Addendum)
"  I need an appointment with Dr.Livesay and do not know who I've been re-assigned to.  I'm on my last bottle of tamoxifen.  My pharmacy said she's retired.  Did not receive return call for my call to reschedule.  I need an appointment.  Office manager can call [phone] to reach me."  Scheduling message sent.

## 2016-09-26 ENCOUNTER — Telehealth: Payer: Self-pay | Admitting: Hematology

## 2016-09-26 NOTE — Telephone Encounter (Signed)
Former [facility] patient. Spoke with patient re lab/fu with YD 5/17. Ok per YF. Per patient see has enough tomoxifen until then.

## 2016-10-11 ENCOUNTER — Other Ambulatory Visit (HOSPITAL_BASED_OUTPATIENT_CLINIC_OR_DEPARTMENT_OTHER): Payer: Medicare Other

## 2016-10-11 ENCOUNTER — Encounter: Payer: Self-pay | Admitting: Hematology

## 2016-10-11 ENCOUNTER — Ambulatory Visit (HOSPITAL_BASED_OUTPATIENT_CLINIC_OR_DEPARTMENT_OTHER): Payer: Medicare Other | Admitting: Hematology

## 2016-10-11 ENCOUNTER — Telehealth: Payer: Self-pay | Admitting: Hematology

## 2016-10-11 VITALS — BP 142/84 | HR 102 | Temp 98.6°F | Resp 17 | Ht 65.0 in | Wt 187.6 lb

## 2016-10-11 DIAGNOSIS — Z170 Estrogen receptor positive status [ER+]: Secondary | ICD-10-CM | POA: Diagnosis not present

## 2016-10-11 DIAGNOSIS — I10 Essential (primary) hypertension: Secondary | ICD-10-CM

## 2016-10-11 DIAGNOSIS — D0511 Intraductal carcinoma in situ of right breast: Secondary | ICD-10-CM | POA: Diagnosis not present

## 2016-10-11 DIAGNOSIS — D649 Anemia, unspecified: Secondary | ICD-10-CM

## 2016-10-11 LAB — CBC WITH DIFFERENTIAL/PLATELET
BASO%: 0.6 % (ref 0.0–2.0)
BASOS ABS: 0 10*3/uL (ref 0.0–0.1)
EOS ABS: 0.1 10*3/uL (ref 0.0–0.5)
EOS%: 1.3 % (ref 0.0–7.0)
HEMATOCRIT: 39.5 % (ref 34.8–46.6)
HEMOGLOBIN: 13.2 g/dL (ref 11.6–15.9)
LYMPH#: 2.2 10*3/uL (ref 0.9–3.3)
LYMPH%: 32.1 % (ref 14.0–49.7)
MCH: 31.4 pg (ref 25.1–34.0)
MCHC: 33.3 g/dL (ref 31.5–36.0)
MCV: 94.1 fL (ref 79.5–101.0)
MONO#: 0.4 10*3/uL (ref 0.1–0.9)
MONO%: 5.4 % (ref 0.0–14.0)
NEUT#: 4.1 10*3/uL (ref 1.5–6.5)
NEUT%: 60.6 % (ref 38.4–76.8)
PLATELETS: 217 10*3/uL (ref 145–400)
RBC: 4.2 10*6/uL (ref 3.70–5.45)
RDW: 14.4 % (ref 11.2–14.5)
WBC: 6.7 10*3/uL (ref 3.9–10.3)

## 2016-10-11 LAB — COMPREHENSIVE METABOLIC PANEL
ALBUMIN: 4.3 g/dL (ref 3.5–5.0)
ALK PHOS: 75 U/L (ref 40–150)
ALT: 33 U/L (ref 0–55)
AST: 38 U/L — ABNORMAL HIGH (ref 5–34)
Anion Gap: 11 mEq/L (ref 3–11)
BUN: 13 mg/dL (ref 7.0–26.0)
CALCIUM: 9.6 mg/dL (ref 8.4–10.4)
CHLORIDE: 107 meq/L (ref 98–109)
CO2: 23 mEq/L (ref 22–29)
Creatinine: 0.9 mg/dL (ref 0.6–1.1)
EGFR: 66 mL/min/{1.73_m2} — ABNORMAL LOW (ref >90)
Glucose: 125 mg/dl (ref 70–140)
POTASSIUM: 3.6 meq/L (ref 3.5–5.1)
Sodium: 141 mEq/L (ref 136–145)
Total Bilirubin: 0.39 mg/dL (ref 0.20–1.20)
Total Protein: 7.2 g/dL (ref 6.4–8.3)

## 2016-10-11 MED ORDER — RALOXIFENE HCL 60 MG PO TABS: 60.0000 mg | ORAL_TABLET | Freq: Every day | ORAL | 1 refills | Status: DC

## 2016-10-11 MED ORDER — TAMOXIFEN CITRATE 20 MG PO TABS: 20.0000 mg | ORAL_TABLET | Freq: Every day | ORAL | 1 refills | Status: DC

## 2016-10-11 NOTE — Progress Notes (Signed)
[place]  [phone] [phone]  Clinic Progress Note   Patient Care Team: Antony Contras, MD as PCP - General (Family Medicine) 10/11/2016  CHIEF COMPLAINTS:  Follow up of right breast DCIS  ONCOLOGIC HISTORY 1.  She had diagnostic right mammogram at [facility] on 10-01-13, which confirmed 7 mm cluster of calcifications right lower outer quadrant.  2. Stereotactic biopsy at [facility] 10-06-2013 [phone]) showed intermediate grade DCIS 44mm, ER 100%, PR 100%.  3. She had lumpectomy with radioactive seed localization by Dr Marlou Starks 11-04-2013, with no residual DCIS in the specimen ([id number]).  3. patient has elected no radiation therapy.  4. She began tamoxifen 12-05-2013.  HISTORY OF PRESENTING ILLNESS (10/11/16):  Rachel Edwards 66 y.o. female is a former patient of Dr. Marko Plume. This is my first encounter with her.  The patient was hospitalized from 08/06/16 - 08/08/16 for anemia and chest pain. The patient received a blood transfusion and underwent endoscopy showing 5 cm hiatal hernia and [name] ulcers. She has been taking oral iron supplement since then.  She here today to establish care. She reports she is doing well. She is taking Effexor for hot flashes, which are manageable. Patient performs occasional self breast exams.    CURRENT THERAPY: Tamoxifen started 12/05/13, stopped 10/11/16 due to possible drug interactions with Wellbutrin, will switched to Raloxifene 60mg  daily   MEDICAL HISTORY:  Past Medical History:  Diagnosis Date  . Anemia 08/06/2016  . Depression   . Hiatal hernia   . Hyperlipidemia   . Hypertension   . Panic attack   . Uterine fibroid   . Wears glasses     SURGICAL HISTORY: Past Surgical History:  Procedure Laterality Date  . ABDOMINAL HYSTERECTOMY  1998  . BREAST LUMPECTOMY WITH RADIOACTIVE SEED LOCALIZATION Right 11/04/2013   Procedure: RIGHT BREAST LUMPECTOMY WITH RADIOACTIVE SEED LOCALIZATION;   Surgeon: Merrie Roof, MD;  Location: [place];  Service: General;  Laterality: Right;  . DILATION AND CURETTAGE OF UTERUS     x2  . ESOPHAGOGASTRODUODENOSCOPY (EGD) WITH PROPOFOL N/A 08/08/2016   Procedure: ESOPHAGOGASTRODUODENOSCOPY (EGD) WITH PROPOFOL;  Surgeon: Otis Brace, MD;  Location: [place];  Service: Gastroenterology;  Laterality: N/A;  . HERNIA REPAIR     umbil -age 41  . TONSILLECTOMY    . WISDOM TOOTH EXTRACTION      SOCIAL HISTORY: Social History   Social History  . Marital status: Married    Spouse name: N/A  . Number of children: N/A  . Years of education: N/A   Occupational History  . Not on file.   Social History Main Topics  . Smoking status: Never Smoker  . Smokeless tobacco: Never Used  . Alcohol use 1.0 - 1.5 oz/week    2 - 3 Standard drinks or equivalent per week     Comment: OCCASIONAL  . Drug use: No  . Sexual activity: Not on file   Other Topics Concern  . Not on file   Social History Narrative  . No narrative on file    FAMILY HISTORY: Family History  Problem Relation Age of Onset  . Pneumonia Mother   . Heart attack Father   . Cancer Paternal Grandmother   . Cancer Maternal Grandfather     ALLERGIES:  has No Known Allergies.  MEDICATIONS:  Current Outpatient Prescriptions  Medication Sig Dispense Refill  . aspirin 81 MG tablet Take 81 mg by mouth daily.    Marland Kitchen buPROPion [facility]  XL) 300 MG 24 hr tablet Take 300 mg by mouth daily.     . busPIRone (BUSPAR) 15 MG tablet Take 15 mg by mouth 2 (two) times daily.     . calcium carbonate (OS-CAL) 600 MG TABS tablet Take 600 mg by mouth daily with breakfast.     . Calcium Polycarbophil (FIBER-CAPS PO) Take 1 capsule by mouth.    . cetirizine (ZYRTEC) 10 MG tablet Take 10 mg by mouth daily.    . CRESTOR 10 MG tablet Take 10 mg by mouth daily.     . ferrous sulfate 325 (65 FE) MG tablet Take 325 mg by mouth 2 (two) times daily with a meal.    . lisinopril  (PRINIVIL,ZESTRIL) 5 MG tablet Take 5 mg by mouth daily.     . Multiple Vitamin (MULTIVITAMIN WITH MINERALS) TABS tablet Take 1 tablet by mouth daily.    Marland Kitchen MYRBETRIQ 50 MG TB24 tablet Take 50 mg by mouth daily.  12  . pantoprazole (PROTONIX) 40 MG tablet Take 1 tablet (40 mg total) by mouth daily. (Patient taking differently: Take 40 mg by mouth 2 (two) times daily. ) 30 tablet 0  . Probiotic Product (PROBIOTIC PO) Take 1 tablet by mouth daily.    . tamoxifen (NOLVADEX) 20 MG tablet Take 1 tablet (20 mg total) by mouth daily. 90 tablet 1  . venlafaxine (EFFEXOR) 37.5 MG tablet Take 37.5 mg by mouth daily.    . vitamin E 400 UNIT capsule Take 400 Units by mouth daily.    . raloxifene (EVISTA) 60 MG tablet Take 1 tablet (60 mg total) by mouth daily. 90 tablet 1   No current facility-administered medications for this visit.     REVIEW OF SYSTEMS:   Constitutional: Denies fevers, chills or abnormal night sweats Eyes: Denies blurriness of vision, double vision or watery eyes Ears, nose, mouth, throat, and face: Denies mucositis or sore throat Respiratory: Denies cough, dyspnea or wheezes Cardiovascular: Denies palpitation, chest discomfort or lower extremity swelling Gastrointestinal:  Denies nausea, heartburn or change in bowel habits Skin: Denies abnormal skin rashes Lymphatics: Denies new lymphadenopathy or easy bruising Neurological:Denies numbness, tingling or new weaknesses Behavioral/Psych: Mood is stable, no new changes  All other systems were reviewed with the patient and are negative.  PHYSICAL EXAMINATION: ECOG PERFORMANCE STATUS: 0 - Asymptomatic  Vitals:   10/11/16 1452  BP: (!) 142/84  Pulse: (!) 102  Resp: 17  Temp: 98.6 F (37 C)   Filed Weights   10/11/16 1452  Weight: 187 lb 9.6 oz (85.1 kg)    GENERAL:alert, no distress and comfortable SKIN: skin color, texture, turgor are normal, no rashes or significant lesions EYES: normal, conjunctiva are pink and  non-injected, sclera clear OROPHARYNX:no exudate, no erythema and lips, buccal mucosa, and tongue normal  NECK: supple, thyroid normal size, non-tender, without nodularity LYMPH:  no palpable lymphadenopathy in the cervical, axillary or inguinal LUNGS: clear to auscultation and percussion with normal breathing effort HEART: regular rate & rhythm and no murmurs and no lower extremity edema ABDOMEN:abdomen soft, non-tender and normal bowel sounds Musculoskeletal:no cyanosis of digits and no clubbing  PSYCH: alert & oriented x 3 with fluent speech NEURO: no focal motor/sensory deficits Breast exam reveals incision is well healed, no skin color change, negative for any evidence of recurrence.   LABORATORY DATA:  I have reviewed the data as listed CBC Latest Ref Rng & Units 10/11/2016 08/08/2016 08/06/2016  WBC 3.9 - 10.3 10e3/uL 6.7 6.1 6.2  Hemoglobin 11.6 - 15.9 g/dL 13.2 10.3(L) 10.5(L)  Hematocrit 34.8 - 46.6 % 39.5 32.0(L) 32.2(L)  Platelets 145 - 400 10e3/uL 217 206 182    PATHOLOGY: Diagnosis 08/08/16 1. Small Intestine Biopsy PEPTIC DUODENITIS NEGATIVE FOR VILLOUS ATROPHY OR DYSPLASIA 2. Stomach, biopsy CHRONIC GASTRITIS NO HELICOBACTER PYLORI ORGANISMS ARE IDENTIFIED (WARTHIN-STARR STAIN) NEGATIVE FOR ATYPIA OR MALIGNANCY Zhaol  Diagnosis 11/04/13 1. Breast, lumpectomy, right - BENIGN BREAST PARENCHYMA. - THERE IS NO EVIDENCE OF MALIGNANCY. - SEE ONCOLOGY TABLE BELOW. 2. Breast, excision, superior and medial margins - BENIGN BREAST PARENCHYMA. - THERE IS NO EVIDENCE OF MALIGNANCY. - SEE COMMENT. Microscopic Comment 1. BREAST, IN SITU CARCINOMA Breast: In situ carcinoma, right (based on previous needle core [id number]). Specimen, including laterality: Right breast. Procedure (include lymph node sampling sentinel-non-sentinel: Core needle biopsy, lumpectomy, and additional superior and medial margin resection. Grade of carcinoma: Intermediate grade. Necrosis:  Present. Estimated tumor size: (glass slide measurement): 0.2 cm Treatment effect: N/A Distance to closest margin: Greater than 0.2 cm to all margins. Breast prognostic profile: Case [id number]. Estrogen receptor: 100%, strong staining intensity. Progesterone receptor: 100%, strong staining intensity. Lymph nodes: Not examined. TNM: pTis, pNX Comments: Histologic evaluation of the current specimen reveals benign breast parenchyma with fibrocystic changes and healing biopsy site. Ductal carcinoma in situ is not identified in the current specimen. Review of the patient's previous core biopsy ([id number]), does confirm the presence of intermediate grade ductal carcinoma in situ with associated calcifications. (JBK:ecj 11/09/2013) 2. The surgical resection margins have been inked and microscopically evaluated. 1 of 2 FINAL for CABRIA, MICALIZZI ([id number]) Microscopic Comment(continued) Enid Cutter MD Pathologist, Electronic Signature (Case signed 11/09/2013) Specimen Gross and Clinical Information Specimen(s) Obtained: 1. Breast, lumpectomy, right 2. Breast, excision, superior and medial margins Specimen Clinical Information 1. 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 Specimen type: Right breast lumpectomy, in formalin at 1522 hours. Size: 6.2 x 5 x 2.8 cm. Orientation: The specimen is previously inked as follows, green anterior, blue inferior, orange lateral, yellow medial, black posterior, red superior. Localized area: There is an inserted pin, localizing a radioactive seed implant. Cut surface: On sectioning through the area with the pin, a radioactive seed implant is identified and sent to nuclear medicine. Also within this area is a 0.6 cm in diameter cavity containing red-brown soft material and a metallic clip. Surrounding the cavity is a 1.7 x 1.5 x 1.3 cm area of gray-white to yellow-red indurated, ill-defined tissue. Margins: The area of indurated tissue  surrounding the cavity comes within 0.3 cm of the superior and medial margins, 0.5 cm of the posterior margin and greater than 1 cm from the remaining margins. Prognostic indicators: Not taken at the time of gross. Block summary: A - C = cavity/tissue with clip, and nearest medial and superior margins. D, E = posterior margin nearest tissue with clip. F = anterior margin nearest tissue with clip. G = inferior margin nearest tissue with clip. H = lateral margin nearest tissue with clip. Total, eight blocks. 2. Received fresh is a 4.5 x 4.2 cm irregular portion of fibrofatty tissue, which is up to 1 cm thick, and clinically identified as new superior and medial margins of right breast. There is a short single suture at superior edge, long single suture at medial edge, and dull suture on the new true surgical margin. The new margin is inked black. On sectioning, there is predominantly soft fatty tissue with minimal gray-white soft fibrous tissue. Representative sections are submitted  as follows: A, B = superior. C, E = inferior. F, G = medial. H - J = lateral. Total, ten blocks. (SSW:ecj 11/05/2013)  Diagnosis 10/06/13 Breast, right, needle core biopsy, right lower outer quadrant - DUCTAL CARCINOMA IN SITU WITH CALCIFICATIONS. Microscopic Comment There is intermediate grade ductal carcinoma in situ with calcifications. Estrogen and progesterone receptors will be performed. 1 of 2 FINAL for Glatt, Gurneet T [phone]) Microscopic Comment(continued) Dr. Lyndon Code agrees.  RADIOGRAPHIC STUDIES: I have personally reviewed the radiological images as listed and agreed with the findings in the report. No results found.   CT A/P 08/06/16 IMPRESSION: 1. No cause for the patient's anemia identified. 2. Minimal atherosclerotic change in the abdominal aorta and branching vessels. 3. Moderate hiatal hernia.  Bilateral Breast TOMO and Right Breast US 10/04/15 IMPRESSION: 1.  Stable right breast  lumpectomy site. 2. There is a benign cyst in the inferior right breast at 6 o'clock. 3.  No mammographic evidence of malignancy in the bilateral breasts.  Bilateral Breast TOMO 09/27/14 IMPRESSION: No evidence of malignancy.  Bilateral Breast MRI W WO Contrast 10/13/13 IMPRESSION: Post biopsy change associated with the lower outer quadrant of the right breast. No worrisome areas of enhancement within either breast and no evidence for adenopathy. Incidental hiatal hernia noted.  MM Digital Diagnostic Right Breast 10/01/13 IMPRESSION: Suspicious right breast calcifications, lower outer quadrant. Stereotactic core needle biopsy is recommended. This will be scheduled at the patient's convenience.  ASSESSMENT & PLAN:  66 y.o.  woman with:  1. Intermediate grade DCIS of right breast, ER/PR positive, spanning 2 mm - I have reviewed her medical records, and comfortable and keep findings with patient.  -Patient underwent lumpectomy previously; surgical margins were negative. -Her DCIS was cured. Any further treatment is preventative. - Patient began Tamoxifen 12/05/13, and plans to continue for 5 years. - Patient is post hysterectomy and bilateral salpingo oophorectomy in 1998. - The patient experiences some hot flashes with Tamoxifen, but manages this well with Effexor. - Labs reviewed, all normal. - I discussed potential interactions between Tamoxifen and Welbutrin. I will switch the patient to Roxifen. I discussed the side effects and benefits. The patient is in agreement. I called in today   2. Anemia - Patient was anemic during hospitalization in March 2018. - The patient began taking iron supplements following discharge. - Labs reviewed today, Hgb is 13.2 today, 10/11/16. Anemia resolved. - Continue oral iron. -We'll repeat her iron studies on her next visit  3. HTN - Patient is on BP medication. - She will continue to follow with her PCP for management. - Patient will stay active and  maintain a healthy diet and adequate hydration.  Plan: - Labs reviewed today. - Prescribe Roxifen 60 mg daily. Patient will discontinue Tamoxifen. - Continue oral iron supplement. - Patient is due for mammogram this month, will schedule for her.    Orders Placed This Encounter  Procedures  . MM DIAG BREAST TOMO BILATERAL    Standing Status:   Future    Standing Expiration Date:   12/11/2017    Order Specific Question:   Reason for Exam (SYMPTOM  OR DIAGNOSIS REQUIRED)    Answer:   screening    Order Specific Question:   Preferred imaging location?    Answer:   [facility]  . CBC with Differential    Standing Status:   Standing    Number of Occurrences:   30    Standing Expiration Date:   10/10/2021  .  Comprehensive metabolic panel    Standing Status:   Standing    Number of Occurrences:   30    Standing Expiration Date:   10/10/2021    All questions were answered. The patient knows to call the clinic with any problems, questions or concerns.  I spent 25 minutes counseling the patient face to face. The total time spent in the appointment was 30 minutes and more than 50% was on counseling.  This document serves as a record of services personally performed by Truitt Merle, MD. It was created on her behalf by Maryla Morrow, a trained medical scribe. The creation of this record is based on the scribe's personal observations and the provider's statements to them. This document has been checked and approved by the attending provider.    Truitt Merle, MD 10/11/2016

## 2016-10-11 NOTE — Telephone Encounter (Signed)
Lab and follow up appointment scheduled with Dr Burr Medico in 6 months, per 10/11/16 los. Patient was given a copy of the AVS report and appointment schedule, per 10/11/16 los.

## 2016-10-30 ENCOUNTER — Encounter: Payer: Self-pay | Admitting: Cardiovascular Disease

## 2016-11-01 ENCOUNTER — Other Ambulatory Visit: Payer: Self-pay | Admitting: Oncology

## 2016-11-01 DIAGNOSIS — Z9889 Other specified postprocedural states: Secondary | ICD-10-CM

## 2016-11-01 DIAGNOSIS — Z853 Personal history of malignant neoplasm of breast: Secondary | ICD-10-CM

## 2016-11-02 NOTE — Progress Notes (Signed)
Cardiology Office Note   Date:  11/06/2016   ID:  Rachel Edwards, DOB [birth year], MRN [id number]  PCP:  Antony Contras, MD  Cardiologist:   Jenkins Rouge, MD   No chief complaint on file.    History of Present Illness: Rachel Edwards is a 66 y.o. female who presents for post hospital f/u Seen in consult by Dr Debara Pickett 08/07/16 For chest pain and tachycardia. History of depression, HTN, HL, panic attacks and breast cancer. Anemic On admission with Hb 9 Received 2 units of blood. R/O MI  Father died of MI in his 79's and brother died suddenly in his sleep in his 23's   Echo reviewed:  08/07/16: no valve disease EF 55 - 60% grade one diastolic dysfunction Aortic Root 3.7 cm  Root 4.1 by CT  No stress testing done   EGD showed gastric ulcers and hiatal hernia      Past Medical History:  Diagnosis Date  . Anemia 08/06/2016  . Depression   . Hiatal hernia   . Hyperlipidemia   . Hypertension   . Panic attack   . Uterine fibroid   . Wears glasses     Past Surgical History:  Procedure Laterality Date  . ABDOMINAL HYSTERECTOMY  1998  . BREAST LUMPECTOMY     right june 2015  . BREAST LUMPECTOMY WITH RADIOACTIVE SEED LOCALIZATION Right 11/04/2013   Procedure: RIGHT BREAST LUMPECTOMY WITH RADIOACTIVE SEED LOCALIZATION;  Surgeon: Merrie Roof, MD;  Location: [place];  Service: General;  Laterality: Right;  . DILATION AND CURETTAGE OF UTERUS     x2  . ESOPHAGOGASTRODUODENOSCOPY (EGD) WITH PROPOFOL N/A 08/08/2016   Procedure: ESOPHAGOGASTRODUODENOSCOPY (EGD) WITH PROPOFOL;  Surgeon: Otis Brace, MD;  Location: [place];  Service: Gastroenterology;  Laterality: N/A;  . HERNIA REPAIR     umbil -age 2  . TONSILLECTOMY    . WISDOM TOOTH EXTRACTION       Current Outpatient Prescriptions  Medication Sig Dispense Refill  . aspirin 81 MG tablet Take 81 mg by mouth daily.    Marland Kitchen buPROPion (WELLBUTRIN XL) 300 MG 24 hr tablet Take 300 mg by mouth daily.       . busPIRone (BUSPAR) 15 MG tablet Take 15 mg by mouth 2 (two) times daily.     . calcium carbonate (OS-CAL) 600 MG TABS tablet Take 600 mg by mouth daily with breakfast.     . Calcium Polycarbophil (FIBER-CAPS PO) Take 1 capsule by mouth.    . cetirizine (ZYRTEC) 10 MG tablet Take 10 mg by mouth daily.    . CRESTOR 10 MG tablet Take 10 mg by mouth daily.     . ferrous sulfate 325 (65 FE) MG tablet Take 325 mg by mouth 2 (two) times daily with a meal.    . lisinopril (PRINIVIL,ZESTRIL) 5 MG tablet Take 5 mg by mouth daily.     . Multiple Vitamin (MULTIVITAMIN WITH MINERALS) TABS tablet Take 1 tablet by mouth daily.    Marland Kitchen MYRBETRIQ 50 MG TB24 tablet Take 50 mg by mouth daily.  12  . pantoprazole (PROTONIX) 40 MG tablet Take 1 tablet (40 mg total) by mouth daily. (Patient taking differently: Take 40 mg by mouth 2 (two) times daily. ) 30 tablet 0  . Probiotic Product (PROBIOTIC PO) Take 1 tablet by mouth daily.    . raloxifene (EVISTA) 60 MG tablet Take 1 tablet (60 mg total) by mouth daily. 90 tablet 1  .  tamoxifen (NOLVADEX) 20 MG tablet Take 1 tablet (20 mg total) by mouth daily. 90 tablet 1  . venlafaxine (EFFEXOR) 37.5 MG tablet Take 37.5 mg by mouth daily.    . vitamin E 400 UNIT capsule Take 400 Units by mouth daily.     No current facility-administered medications for this visit.     Allergies:   Patient has no known allergies.    Social History:  The patient  reports that she has never smoked. She has never used smokeless tobacco. She reports that she drinks about 1.0 - 1.5 oz of alcohol per week . She reports that she does not use drugs.   Family History:  The patient's family history includes Cancer in her maternal grandfather and paternal grandmother; Heart attack in her father; Pneumonia in her mother.    ROS:  Please see the history of present illness.   Otherwise, review of systems are positive for none.   All other systems are reviewed and negative.    PHYSICAL EXAM: VS:   BP 120/84 (BP Location: Right Arm)   Pulse 96   Ht 5\' 6"  (1.676 m)   Wt 84.3 kg (185 lb 12.8 oz)   BMI 29.99 kg/m  , BMI Body mass index is 29.99 kg/m. Affect appropriate Healthy:  appears stated age 64: normal Neck supple with no adenopathy JVP normal no bruits no thyromegaly Lungs clear with no wheezing and good diaphragmatic motion Heart:  S1/S2 no murmur, no rub, gallop or click PMI normal Abdomen: benighn, BS positve, no tenderness, no AAA no bruit.  No HSM or HJR Distal pulses intact with no bruits No edema Neuro non-focal Skin warm and dry No muscular weakness    EKG:  ST rate 103 low voltage poor R wave progression nonspecific ST changes    Recent Labs: 08/06/2016: Magnesium 2.0; TSH 4.943 10/11/2016: ALT 33; BUN 13.0; Creatinine 0.9; HGB 13.2; Platelets 217; Potassium 3.6; Sodium 141    Lipid Panel No results found for: CHOL, TRIG, HDL, CHOLHDL, VLDL, LDLCALC, LDLDIRECT    Wt Readings from Last 3 Encounters:  11/06/16 84.3 kg (185 lb 12.8 oz)  10/11/16 85.1 kg (187 lb 9.6 oz)  08/08/16 82.4 kg (181 lb 11.2 oz)      Other studies Reviewed: Additional studies/ records that were reviewed today include: Hospital records from March Echo consult note Dr Debara Pickett and EGD .    ASSESSMENT AND PLAN:  1.  Chest pain/Family history of CAD: f/u exercise myovue pain in hospital thought to be from GI etiology 2. Gastric Ulcer/Anemia improved f/u GI continue protonix 3. Tachycardia  Relative some due to anemia and anxiety and multiple antidepressant meds TSHT4 Normal in March observe no need for monitor 4. Abnormal ECG low voltage poor R wave progression likely from body habitus echo normal EF 5. Dilated aortic root only 3. 7 by echo observe consider CT in 2 years BP under control  6. Cholesterol  Continue statin labs with Dr Moreen Fowler  7. HTN  Well controlled.  Continue current medications and low sodium Dash type diet.   8. Depression should simplify medication f/u  primary     Current medicines are reviewed at length with the patient today.  The patient does not have concerns regarding medicines.  The following changes have been made:  no change  Labs/ tests ordered today include: None   Orders Placed This Encounter  Procedures  . Myocardial Perfusion Imaging     Disposition:   FU with cardiology  prn     Signed, Jenkins Rouge, MD  11/06/2016 11:54 AM    [address], [place], [place]  [address] Phone: [phone]; Fax: [phone]

## 2016-11-06 ENCOUNTER — Ambulatory Visit
Admission: RE | Admit: 2016-11-06 | Discharge: 2016-11-06 | Disposition: A | Discharge status: Home / Self Care | Payer: Medicare Other | Source: Ambulatory Visit | Attending: Oncology | Admitting: Oncology

## 2016-11-06 ENCOUNTER — Ambulatory Visit (INDEPENDENT_AMBULATORY_CARE_PROVIDER_SITE_OTHER): Payer: Medicare Other | Admitting: Cardiovascular Disease

## 2016-11-06 ENCOUNTER — Encounter: Payer: Self-pay | Admitting: Cardiovascular Disease

## 2016-11-06 VITALS — BP 120/84 | HR 96 | Ht 66.0 in | Wt 185.8 lb

## 2016-11-06 DIAGNOSIS — R079 Chest pain, unspecified: Secondary | ICD-10-CM

## 2016-11-06 DIAGNOSIS — Z853 Personal history of malignant neoplasm of breast: Secondary | ICD-10-CM

## 2016-11-06 DIAGNOSIS — Z9889 Other specified postprocedural states: Secondary | ICD-10-CM

## 2016-11-06 NOTE — Patient Instructions (Addendum)

## 2016-11-22 ENCOUNTER — Telehealth (HOSPITAL_COMMUNITY): Payer: Self-pay | Admitting: *Deleted

## 2016-11-22 ENCOUNTER — Other Ambulatory Visit (HOSPITAL_COMMUNITY): Payer: Self-pay | Admitting: Surgery

## 2016-11-22 DIAGNOSIS — K449 Diaphragmatic hernia without obstruction or gangrene: Secondary | ICD-10-CM

## 2016-11-22 NOTE — Telephone Encounter (Signed)
Left message on voicemail in reference to upcoming appointment scheduled for 11/25/16. Phone number given for a call back so details instructions can be given. Kirstie Peri

## 2016-11-23 ENCOUNTER — Ambulatory Visit: Payer: Medicare Other | Admitting: Interventional Cardiology

## 2016-11-23 NOTE — Progress Notes (Signed)
Please place orders in EPIC as patient is being scheduled for a pre-op appointment! Thank you! 

## 2016-11-26 ENCOUNTER — Ambulatory Visit (HOSPITAL_COMMUNITY): Payer: Medicare Other

## 2016-11-27 ENCOUNTER — Ambulatory Visit (HOSPITAL_COMMUNITY): Payer: Medicare Other | Attending: Cardiovascular Disease

## 2016-11-27 ENCOUNTER — Ambulatory Visit (HOSPITAL_COMMUNITY): Payer: Medicare Other

## 2016-11-27 DIAGNOSIS — Z8249 Family history of ischemic heart disease and other diseases of the circulatory system: Secondary | ICD-10-CM | POA: Insufficient documentation

## 2016-11-27 DIAGNOSIS — R079 Chest pain, unspecified: Secondary | ICD-10-CM | POA: Diagnosis not present

## 2016-11-27 DIAGNOSIS — I10 Essential (primary) hypertension: Secondary | ICD-10-CM | POA: Diagnosis not present

## 2016-11-27 MED ORDER — TECHNETIUM TC 99M TETROFOSMIN IV KIT
32.7000 | PACK | Freq: Once | INTRAVENOUS | Status: AC | PRN
  Administered 2016-11-27: 32.7 via INTRAVENOUS
  Filled 2016-11-27: qty 33

## 2016-11-29 ENCOUNTER — Ambulatory Visit (HOSPITAL_COMMUNITY): Payer: Medicare Other | Attending: Cardiology

## 2016-11-29 LAB — MYOCARDIAL PERFUSION IMAGING
CHL CUP NUCLEAR SSS: 18
CSEPED: 7 min
CSEPEDS: 0 s
CSEPEW: 7 METS
CSEPHR: 95 %
CSEPPHR: 148 {beats}/min
LHR: 0.4
LV dias vol: 49 mL (ref 46–106)
LV sys vol: 14 mL
MPHR: 155 {beats}/min
Rest HR: 87 {beats}/min
SDS: 6
SRS: 12
TID: 0.95

## 2016-11-29 MED ORDER — TECHNETIUM TC 99M TETROFOSMIN IV KIT
31.5000 | PACK | Freq: Once | INTRAVENOUS | Status: AC | PRN
  Administered 2016-11-29: 31.5 via INTRAVENOUS
  Filled 2016-11-29: qty 32

## 2016-12-04 NOTE — Progress Notes (Signed)
11-27-16 (EPIC) Stress Test 08-07-16 (EPIC) EKG, ECHO 08-06-16 (EPIC) CXR

## 2016-12-04 NOTE — Patient Instructions (Signed)
Rachel Edwards  12/04/2016   Your procedure is scheduled on: 12-10-16   Report to [name] Main  Entrance Take Rachel Edwards  Elevators to 3rd floor to  Rachel Edwards at 7:30 AM.    Call this number if you have problems the morning of surgery [phone]    Remember: ONLY 1 PERSON MAY GO WITH YOU TO SHORT STAY TO GET  READY MORNING OF YOUR SURGERY.  Do not eat food or drink liquids :After Midnight.     Take these medicines the morning of surgery with A SIP OF WATER: Bupropion (Wellbutrin), Buspirone (Buspar), Pantoprazole (Protonix) and Tamoxifen  (Nolvadex)                                You may not have any metal on your body including hair pins and              piercings  Do not wear jewelry, make-up, lotions, powders or perfumes, deodorant             Do not wear nail polish.  Do not shave  48 hours prior to surgery.                Do not bring valuables to the hospital. [name].  Contacts, dentures or bridgework may not be worn into surgery.  Leave suitcase in the car. After surgery it may be brought to your room.                 Please read over the following fact sheets you were given: _____________________________________________________________________             [name] - Preparing for Surgery Before surgery, you can play an important role.  Because skin is not sterile, your skin needs to be as free of germs as possible.  You can reduce the number of germs on your skin by washing with CHG (chlorahexidine gluconate) soap before surgery.  CHG is an antiseptic cleaner which kills germs and bonds with the skin to continue killing germs even after washing. Please DO NOT use if you have an allergy to CHG or antibacterial soaps.  If your skin becomes reddened/irritated stop using the CHG and inform your nurse when you arrive at Short Stay. Do not shave (including legs and underarms) for at least 48  hours prior to the first CHG shower.  You may shave your face/neck. Please follow these instructions carefully:  1.  Shower with CHG Soap the night before surgery and the  morning of Surgery.  2.  If you choose to wash your hair, wash your hair first as usual with your  normal  shampoo.  3.  After you shampoo, rinse your hair and body thoroughly to remove the  shampoo.                           4.  Use CHG as you would any other liquid soap.  You can apply chg directly  to the skin and wash                       Gently with a scrungie or clean washcloth.  5.  Apply the CHG Soap to your body ONLY FROM THE NECK DOWN.   Do not use on face/ open                           Wound or open sores. Avoid contact with eyes, ears mouth and genitals (private parts).                       Wash face,  Genitals (private parts) with your normal soap.             6.  Wash thoroughly, paying special attention to the area where your surgery  will be performed.  7.  Thoroughly rinse your body with warm water from the neck down.  8.  DO NOT shower/wash with your normal soap after using and rinsing off  the CHG Soap.                9.  Pat yourself dry with a clean towel.            10.  Wear clean pajamas.            11.  Place clean sheets on your bed the night of your first shower and do not  sleep with pets. Day of Surgery : Do not apply any lotions/deodorants the morning of surgery.  Please wear clean clothes to the hospital/surgery center.  FAILURE TO FOLLOW THESE INSTRUCTIONS MAY RESULT IN THE CANCELLATION OF YOUR SURGERY PATIENT SIGNATURE_________________________________  NURSE SIGNATURE__________________________________  ________________________________________________________________________

## 2016-12-05 ENCOUNTER — Encounter (HOSPITAL_COMMUNITY)
Admission: RE | Admit: 2016-12-05 | Discharge: 2016-12-05 | Disposition: A | Discharge status: Home / Self Care | Payer: Medicare Other | Source: Ambulatory Visit | Attending: Surgery | Admitting: Surgery

## 2016-12-05 ENCOUNTER — Encounter (HOSPITAL_COMMUNITY): Payer: Self-pay

## 2016-12-05 DIAGNOSIS — D6489 Other specified anemias: Secondary | ICD-10-CM | POA: Diagnosis not present

## 2016-12-05 DIAGNOSIS — K219 Gastro-esophageal reflux disease without esophagitis: Secondary | ICD-10-CM | POA: Diagnosis not present

## 2016-12-05 DIAGNOSIS — Z01812 Encounter for preprocedural laboratory examination: Secondary | ICD-10-CM | POA: Insufficient documentation

## 2016-12-05 DIAGNOSIS — K449 Diaphragmatic hernia without obstruction or gangrene: Secondary | ICD-10-CM | POA: Insufficient documentation

## 2016-12-05 DIAGNOSIS — K259 Gastric ulcer, unspecified as acute or chronic, without hemorrhage or perforation: Secondary | ICD-10-CM | POA: Insufficient documentation

## 2016-12-05 LAB — BASIC METABOLIC PANEL
ANION GAP: 11 (ref 5–15)
BUN: 12 mg/dL (ref 6–20)
CHLORIDE: 105 mmol/L (ref 101–111)
CO2: 23 mmol/L (ref 22–32)
Calcium: 9.2 mg/dL (ref 8.9–10.3)
Creatinine, Ser: 0.83 mg/dL (ref 0.44–1.00)
GFR calc Af Amer: >60 mL/min (ref >60)
GLUCOSE: 101 mg/dL — AB (ref 65–99)
POTASSIUM: 4.1 mmol/L (ref 3.5–5.1)
SODIUM: 139 mmol/L (ref 135–145)

## 2016-12-05 LAB — CBC
HEMATOCRIT: 38.3 % (ref 36.0–46.0)
HEMOGLOBIN: 13.1 g/dL (ref 12.0–15.0)
MCH: 31.6 pg (ref 26.0–34.0)
MCHC: 34.2 g/dL (ref 30.0–36.0)
MCV: 92.5 fL (ref 78.0–100.0)
Platelets: 207 10*3/uL (ref 150–400)
RBC: 4.14 MIL/uL (ref 3.87–5.11)
RDW: 12.6 % (ref 11.5–15.5)
WBC: 6.8 10*3/uL (ref 4.0–10.5)

## 2016-12-09 ENCOUNTER — Ambulatory Visit: Payer: Self-pay | Admitting: Surgery

## 2016-12-09 NOTE — H&P (Signed)
Chief Complaint:  Rachel Edwards and Rachel Edwards ulcers  History of Present Illness:  Rachel Edwards is an 66 y.o. female who was seen in the office in late June with a history of anemia felt secondary to [facility] ulcers in a hiatal hernia.  Informed consent was obtained and she presents for surgery.    Past Medical History:  Diagnosis Date  . Anemia 08/06/2016  . Depression   . Hiatal hernia   . Hyperlipidemia   . Hypertension   . Panic attack   . Uterine fibroid   . Wears glasses     Past Surgical History:  Procedure Laterality Date  . ABDOMINAL HYSTERECTOMY  1998  . BREAST LUMPECTOMY     right june 2015  . BREAST LUMPECTOMY WITH RADIOACTIVE SEED LOCALIZATION Right 11/04/2013   Procedure: RIGHT BREAST LUMPECTOMY WITH RADIOACTIVE SEED LOCALIZATION;  Surgeon: Merrie Roof, MD;  Location: [place];  Service: General;  Laterality: Right;  . DILATION AND CURETTAGE OF UTERUS     x2  . ESOPHAGOGASTRODUODENOSCOPY (EGD) WITH PROPOFOL N/A 08/08/2016   Procedure: ESOPHAGOGASTRODUODENOSCOPY (EGD) WITH PROPOFOL;  Surgeon: Otis Brace, MD;  Location: [place];  Service: Gastroenterology;  Laterality: N/A;  . HERNIA REPAIR     umbil -age 31  . TONSILLECTOMY    . WISDOM TOOTH EXTRACTION      Current Outpatient Prescriptions  Medication Sig Dispense Refill  . albuterol (PROVENTIL HFA;VENTOLIN HFA) 108 (90 Base) MCG/ACT inhaler Inhale 2 puffs into the lungs every 6 (six) hours as needed for wheezing or shortness of breath.    Marland Kitchen aspirin 81 MG tablet Take 81 mg by mouth daily.    Marland Kitchen buPROPion (WELLBUTRIN XL) 300 MG 24 hr tablet Take 300 mg by mouth daily.     . busPIRone (BUSPAR) 15 MG tablet Take 15 mg by mouth 2 (two) times daily.     . calcium carbonate (OS-CAL) 600 MG TABS tablet Take 600 mg by mouth daily with breakfast.     . Calcium Polycarbophil (FIBER-CAPS PO) Take 1 capsule by mouth daily.     . cetirizine (ZYRTEC) 10 MG tablet Take 10 mg by mouth at bedtime.      . Chlorpheniramine Maleate (CHLOR-TRIMETON PO) Take 1 tablet by mouth daily.    . CRESTOR 10 MG tablet Take 10 mg by mouth at bedtime.     . ferrous sulfate 325 (65 FE) MG tablet Take 325 mg by mouth 2 (two) times daily with a meal.    . lisinopril (PRINIVIL,ZESTRIL) 5 MG tablet Take 5 mg by mouth daily.     . Multiple Vitamin (MULTIVITAMIN WITH MINERALS) TABS tablet Take 1 tablet by mouth daily.    Marland Kitchen MYRBETRIQ 50 MG TB24 tablet Take 50 mg by mouth daily.  12  . pantoprazole (PROTONIX) 40 MG tablet Take 1 tablet (40 mg total) by mouth daily. (Patient taking differently: Take 40 mg by mouth 2 (two) times daily. ) 30 tablet 0  . Probiotic Product (PROBIOTIC PO) Take 1 tablet by mouth daily.    . raloxifene (EVISTA) 60 MG tablet Take 1 tablet (60 mg total) by mouth daily. 90 tablet 1  . tamoxifen (NOLVADEX) 20 MG tablet Take 1 tablet (20 mg total) by mouth daily. 90 tablet 1  . venlafaxine XR (EFFEXOR-XR) 37.5 MG 24 hr capsule Take 37.5 mg by mouth at bedtime.    . vitamin E 400 UNIT capsule Take 400 Units by mouth daily.     No  current facility-administered medications for this visit.    Patient has no known allergies. Family History  Problem Relation Age of Onset  . Pneumonia Mother   . Heart attack Father   . Cancer Paternal Grandmother   . Cancer Maternal Grandfather    Social History:   reports that she has never smoked. She has never used smokeless tobacco. She reports that she drinks about 1.0 - 1.5 oz of alcohol per week . She reports that she does not use drugs.   REVIEW OF SYSTEMS : Negative except for see problem li9st  Physical Exam:   There were no vitals taken for this visit. There is no height or weight on file to calculate BMI.  Gen:  WDWN WF NAD  Neurological: Alert and oriented to person, place, and time. Motor and sensory function is grossly intact  Head: Normocephalic and atraumatic.  Eyes: Conjunctivae are normal. Pupils are equal, round, and reactive to light.  No scleral icterus.  Neck: Normal range of motion. Neck supple. No tracheal deviation or thyromegaly present.  Cardiovascular:  SR without murmurs or gallops.  No carotid bruits Breast:  Not examined  Respiratory: Effort normal.  No respiratory distress. No chest wall tenderness. Breath sounds normal.  No wheezes, rales or rhonchi.  Abdomen:  nontender GU:  Not examined Musculoskeletal: Normal range of motion. Extremities are nontender. No cyanosis, edema or clubbing noted Lymphadenopathy: No cervical, preauricular, postauricular or axillary adenopathy is present Skin: Skin is warm and dry. No rash noted. No diaphoresis. No erythema. No pallor. Pscyh: Normal mood and affect. Behavior is normal. Judgment and thought content normal.   LABORATORY RESULTS: No results found for this or any previous visit (from the past 48 hour(s)).   RADIOLOGY RESULTS: No results found.  Problem List: Patient Active Problem List   Diagnosis Date Noted  . Diastolic dysfunction without heart failure 08/08/2016  . Dilatation of aorta ([place]) 08/08/2016  . Chest pain 08/06/2016  . Symptomatic anemia 08/06/2016  . Fecal occult blood test positive 08/06/2016  . Situational depression 08/06/2016  . Anemia   . Status post total hysterectomy and bilateral salpingo-oophorectomy 02/24/2015  . High risk medication use 02/24/2015  . DCIS (ductal carcinoma in situ) of breast 02/24/2015  . Estrogen receptor positive status (ER+) 07/31/2014  . 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  . Obesity, Class I, BMI 30.0-34.9 (see actual BMI) 12/04/2013  . Breast cancer of lower-outer quadrant of right female breast ([place]) 10/09/2013    Assessment & Plan: GER, anemia and Rachel Edwards ulcers with a hiatal hernia  Lap repair hiatal hernia and Nissen fundoplication    Matt B. Hassell Done, MD, [facility] [phone] beeper [phone]  12/09/2016 9:10 PM

## 2016-12-10 ENCOUNTER — Ambulatory Visit (HOSPITAL_COMMUNITY): Payer: Medicare Other | Admitting: Anesthesiology

## 2016-12-10 ENCOUNTER — Encounter (HOSPITAL_COMMUNITY)
Admission: RE | Disposition: A | Discharge status: Home / Self Care | Payer: Self-pay | Source: Ambulatory Visit | Attending: Surgery

## 2016-12-10 ENCOUNTER — Encounter (HOSPITAL_COMMUNITY): Payer: Self-pay | Admitting: *Deleted

## 2016-12-10 ENCOUNTER — Observation Stay (HOSPITAL_COMMUNITY)
Admission: RE | Admit: 2016-12-10 | Discharge: 2016-12-12 | Disposition: A | Discharge status: Home / Self Care | Payer: Medicare Other | Source: Ambulatory Visit | Attending: Surgery | Admitting: Surgery

## 2016-12-10 DIAGNOSIS — Z809 Family history of malignant neoplasm, unspecified: Secondary | ICD-10-CM | POA: Diagnosis not present

## 2016-12-10 DIAGNOSIS — K259 Gastric ulcer, unspecified as acute or chronic, without hemorrhage or perforation: Secondary | ICD-10-CM | POA: Insufficient documentation

## 2016-12-10 DIAGNOSIS — Z79899 Other long term (current) drug therapy: Secondary | ICD-10-CM | POA: Diagnosis not present

## 2016-12-10 DIAGNOSIS — F410 Panic disorder [episodic paroxysmal anxiety] without agoraphobia: Secondary | ICD-10-CM | POA: Diagnosis not present

## 2016-12-10 DIAGNOSIS — Z90710 Acquired absence of both cervix and uterus: Secondary | ICD-10-CM | POA: Insufficient documentation

## 2016-12-10 DIAGNOSIS — K449 Diaphragmatic hernia without obstruction or gangrene: Secondary | ICD-10-CM | POA: Diagnosis not present

## 2016-12-10 DIAGNOSIS — Z9889 Other specified postprocedural states: Secondary | ICD-10-CM

## 2016-12-10 DIAGNOSIS — Z79810 Long term (current) use of selective estrogen receptor modulators (SERMs): Secondary | ICD-10-CM | POA: Insufficient documentation

## 2016-12-10 DIAGNOSIS — E785 Hyperlipidemia, unspecified: Secondary | ICD-10-CM | POA: Diagnosis not present

## 2016-12-10 DIAGNOSIS — Z7982 Long term (current) use of aspirin: Secondary | ICD-10-CM | POA: Diagnosis not present

## 2016-12-10 DIAGNOSIS — I10 Essential (primary) hypertension: Secondary | ICD-10-CM | POA: Insufficient documentation

## 2016-12-10 DIAGNOSIS — F329 Major depressive disorder, single episode, unspecified: Secondary | ICD-10-CM | POA: Diagnosis not present

## 2016-12-10 DIAGNOSIS — K219 Gastro-esophageal reflux disease without esophagitis: Secondary | ICD-10-CM | POA: Diagnosis not present

## 2016-12-10 HISTORY — PX: LAPAROSCOPIC NISSEN FUNDOPLICATION: SHX1932

## 2016-12-10 LAB — CREATININE, SERUM
CREATININE: 1.11 mg/dL — AB (ref 0.44–1.00)
GFR calc non Af Amer: 51 mL/min — ABNORMAL LOW (ref >60)
GFR, EST AFRICAN AMERICAN: 59 mL/min — AB (ref >60)

## 2016-12-10 LAB — CBC
HCT: 37.4 % (ref 36.0–46.0)
Hemoglobin: 12.9 g/dL (ref 12.0–15.0)
MCH: 31.9 pg (ref 26.0–34.0)
MCHC: 34.5 g/dL (ref 30.0–36.0)
MCV: 92.6 fL (ref 78.0–100.0)
PLATELETS: 142 10*3/uL — AB (ref 150–400)
RBC: 4.04 MIL/uL (ref 3.87–5.11)
RDW: 12.6 % (ref 11.5–15.5)
WBC: 14.2 10*3/uL — AB (ref 4.0–10.5)

## 2016-12-10 SURGERY — FUNDOPLICATION, NISSEN, LAPAROSCOPIC
Anesthesia: General

## 2016-12-10 MED ORDER — GABAPENTIN 300 MG PO CAPS
300.0000 mg | ORAL_CAPSULE | ORAL | Status: AC
  Administered 2016-12-10: 300 mg via ORAL
  Filled 2016-12-10: qty 1

## 2016-12-10 MED ORDER — LACTATED RINGERS IV SOLN: INTRAVENOUS | Status: DC

## 2016-12-10 MED ORDER — PANTOPRAZOLE SODIUM 40 MG IV SOLR
40.0000 mg | Freq: Every day | INTRAVENOUS | Status: DC
  Administered 2016-12-10 – 2016-12-11 (×2): 40 mg via INTRAVENOUS
  Filled 2016-12-10 (×2): qty 40

## 2016-12-10 MED ORDER — FENTANYL CITRATE (PF) 100 MCG/2ML IJ SOLN
12.5000 ug | INTRAMUSCULAR | Status: DC | PRN
  Filled 2016-12-10: qty 2

## 2016-12-10 MED ORDER — CHLORHEXIDINE GLUCONATE CLOTH 2 % EX PADS: 6.0000 | MEDICATED_PAD | Freq: Once | CUTANEOUS | Status: DC

## 2016-12-10 MED ORDER — ALBUTEROL SULFATE (2.5 MG/3ML) 0.083% IN NEBU: 2.5000 mg | INHALATION_SOLUTION | Freq: Four times a day (QID) | RESPIRATORY_TRACT | Status: DC | PRN

## 2016-12-10 MED ORDER — BUPIVACAINE LIPOSOME 1.3 % IJ SUSP
INTRAMUSCULAR | Status: DC | PRN
  Administered 2016-12-10: 20 mL

## 2016-12-10 MED ORDER — DEXAMETHASONE SODIUM PHOSPHATE 10 MG/ML IJ SOLN
INTRAMUSCULAR | Status: DC | PRN
  Administered 2016-12-10: 10 mg via INTRAVENOUS

## 2016-12-10 MED ORDER — CEFOTETAN DISODIUM-DEXTROSE 2-2.08 GM-% IV SOLR
2.0000 g | Freq: Two times a day (BID) | INTRAVENOUS | Status: AC
  Administered 2016-12-10: 2 g via INTRAVENOUS
  Filled 2016-12-10: qty 50

## 2016-12-10 MED ORDER — DIPHENHYDRAMINE HCL 50 MG/ML IJ SOLN: 12.5000 mg | Freq: Four times a day (QID) | INTRAMUSCULAR | Status: DC | PRN

## 2016-12-10 MED ORDER — 0.9 % SODIUM CHLORIDE (POUR BTL) OPTIME
TOPICAL | Status: DC | PRN
  Administered 2016-12-10: 1000 mL

## 2016-12-10 MED ORDER — DIPHENHYDRAMINE HCL 12.5 MG/5ML PO ELIX: 12.5000 mg | ORAL_SOLUTION | Freq: Four times a day (QID) | ORAL | Status: DC | PRN

## 2016-12-10 MED ORDER — HYDROMORPHONE HCL-NACL 0.5-0.9 MG/ML-% IV SOSY
0.2500 mg | PREFILLED_SYRINGE | INTRAVENOUS | Status: DC | PRN
  Administered 2016-12-10 (×2): 0.5 mg via INTRAVENOUS

## 2016-12-10 MED ORDER — MIDAZOLAM HCL 2 MG/2ML IJ SOLN
INTRAMUSCULAR | Status: AC
  Filled 2016-12-10: qty 2

## 2016-12-10 MED ORDER — MIDAZOLAM HCL 5 MG/5ML IJ SOLN
INTRAMUSCULAR | Status: DC | PRN
  Administered 2016-12-10: 2 mg via INTRAVENOUS

## 2016-12-10 MED ORDER — ONDANSETRON 4 MG PO TBDP: 4.0000 mg | ORAL_TABLET | Freq: Four times a day (QID) | ORAL | Status: DC | PRN

## 2016-12-10 MED ORDER — PROMETHAZINE HCL 25 MG/ML IJ SOLN: 6.2500 mg | INTRAMUSCULAR | Status: DC | PRN

## 2016-12-10 MED ORDER — CELECOXIB 200 MG PO CAPS
400.0000 mg | ORAL_CAPSULE | ORAL | Status: AC
  Administered 2016-12-10: 400 mg via ORAL
  Filled 2016-12-10: qty 2

## 2016-12-10 MED ORDER — FENTANYL CITRATE (PF) 100 MCG/2ML IJ SOLN
INTRAMUSCULAR | Status: AC
  Filled 2016-12-10: qty 2

## 2016-12-10 MED ORDER — LIDOCAINE 2% (20 MG/ML) 5 ML SYRINGE
INTRAMUSCULAR | Status: DC | PRN
  Administered 2016-12-10: 1 mg/kg/h via INTRAVENOUS

## 2016-12-10 MED ORDER — ONDANSETRON HCL 4 MG/2ML IJ SOLN
INTRAMUSCULAR | Status: DC | PRN
  Administered 2016-12-10 (×2): 4 mg via INTRAVENOUS

## 2016-12-10 MED ORDER — LACTATED RINGERS IV SOLN
INTRAVENOUS | Status: DC
  Administered 2016-12-10 (×3): via INTRAVENOUS

## 2016-12-10 MED ORDER — CEFOTETAN DISODIUM-DEXTROSE 2-2.08 GM-% IV SOLR
2.0000 g | INTRAVENOUS | Status: AC
  Administered 2016-12-10: 2 g via INTRAVENOUS
  Filled 2016-12-10: qty 50

## 2016-12-10 MED ORDER — PROPOFOL 10 MG/ML IV BOLUS
INTRAVENOUS | Status: DC | PRN
  Administered 2016-12-10: 200 mg via INTRAVENOUS

## 2016-12-10 MED ORDER — ONDANSETRON HCL 4 MG/2ML IJ SOLN: 4.0000 mg | Freq: Four times a day (QID) | INTRAMUSCULAR | Status: DC | PRN

## 2016-12-10 MED ORDER — PROPOFOL 10 MG/ML IV BOLUS
INTRAVENOUS | Status: AC
  Filled 2016-12-10: qty 20

## 2016-12-10 MED ORDER — ROCURONIUM BROMIDE 10 MG/ML (PF) SYRINGE
PREFILLED_SYRINGE | INTRAVENOUS | Status: DC | PRN
  Administered 2016-12-10: 10 mg via INTRAVENOUS
  Administered 2016-12-10: 20 mg via INTRAVENOUS
  Administered 2016-12-10 (×2): 15 mg via INTRAVENOUS
  Administered 2016-12-10: 50 mg via INTRAVENOUS
  Administered 2016-12-10: 15 mg via INTRAVENOUS

## 2016-12-10 MED ORDER — HEPARIN SODIUM (PORCINE) 5000 UNIT/ML IJ SOLN
5000.0000 [IU] | Freq: Three times a day (TID) | INTRAMUSCULAR | Status: DC
  Administered 2016-12-10 – 2016-12-12 (×5): 5000 [IU] via SUBCUTANEOUS
  Filled 2016-12-10 (×5): qty 1

## 2016-12-10 MED ORDER — PROMETHAZINE HCL 25 MG/ML IJ SOLN: 12.5000 mg | Freq: Four times a day (QID) | INTRAMUSCULAR | Status: DC | PRN

## 2016-12-10 MED ORDER — KETOROLAC TROMETHAMINE 30 MG/ML IJ SOLN: 30.0000 mg | Freq: Once | INTRAMUSCULAR | Status: DC | PRN

## 2016-12-10 MED ORDER — OXYCODONE-ACETAMINOPHEN 5-325 MG PO TABS
1.0000 | ORAL_TABLET | ORAL | Status: DC | PRN
  Administered 2016-12-10 – 2016-12-11 (×5): 1 via ORAL
  Administered 2016-12-12: 2 via ORAL
  Filled 2016-12-10 (×2): qty 1
  Filled 2016-12-10: qty 2
  Filled 2016-12-10 (×3): qty 1

## 2016-12-10 MED ORDER — BUPIVACAINE LIPOSOME 1.3 % IJ SUSP
20.0000 mL | Freq: Once | INTRAMUSCULAR | Status: AC
  Administered 2016-12-10: 20 mL
  Filled 2016-12-10: qty 20

## 2016-12-10 MED ORDER — LACTATED RINGERS IR SOLN
Status: DC | PRN
  Administered 2016-12-10: 1000 mL

## 2016-12-10 MED ORDER — LIDOCAINE 2% (20 MG/ML) 5 ML SYRINGE
INTRAMUSCULAR | Status: AC
  Filled 2016-12-10: qty 5

## 2016-12-10 MED ORDER — HEPARIN SODIUM (PORCINE) 5000 UNIT/ML IJ SOLN
5000.0000 [IU] | Freq: Once | INTRAMUSCULAR | Status: AC
  Administered 2016-12-10: 5000 [IU] via SUBCUTANEOUS
  Filled 2016-12-10: qty 1

## 2016-12-10 MED ORDER — ROCURONIUM BROMIDE 50 MG/5ML IV SOSY
PREFILLED_SYRINGE | INTRAVENOUS | Status: AC
  Filled 2016-12-10: qty 5

## 2016-12-10 MED ORDER — FENTANYL CITRATE (PF) 100 MCG/2ML IJ SOLN
INTRAMUSCULAR | Status: DC | PRN
  Administered 2016-12-10: 50 ug via INTRAVENOUS
  Administered 2016-12-10: 25 ug via INTRAVENOUS
  Administered 2016-12-10: 100 ug via INTRAVENOUS
  Administered 2016-12-10: 25 ug via INTRAVENOUS

## 2016-12-10 MED ORDER — HYDRALAZINE HCL 20 MG/ML IJ SOLN
10.0000 mg | INTRAMUSCULAR | Status: DC | PRN
  Filled 2016-12-10: qty 0.5

## 2016-12-10 MED ORDER — SODIUM CHLORIDE 0.9 % IJ SOLN
INTRAMUSCULAR | Status: AC
  Filled 2016-12-10: qty 10

## 2016-12-10 MED ORDER — HYDROMORPHONE HCL-NACL 0.5-0.9 MG/ML-% IV SOSY
PREFILLED_SYRINGE | INTRAVENOUS | Status: AC
  Filled 2016-12-10: qty 2

## 2016-12-10 MED ORDER — ACETAMINOPHEN 500 MG PO TABS
1000.0000 mg | ORAL_TABLET | ORAL | Status: AC
  Administered 2016-12-10: 1000 mg via ORAL
  Filled 2016-12-10: qty 2

## 2016-12-10 MED ORDER — SUGAMMADEX SODIUM 200 MG/2ML IV SOLN
INTRAVENOUS | Status: AC
  Filled 2016-12-10: qty 2

## 2016-12-10 MED ORDER — KETAMINE HCL 10 MG/ML IJ SOLN
INTRAMUSCULAR | Status: DC | PRN
  Administered 2016-12-10: 40 mg via INTRAVENOUS
  Administered 2016-12-10: 10 mg via INTRAVENOUS

## 2016-12-10 MED ORDER — KCL IN DEXTROSE-NACL 20-5-0.45 MEQ/L-%-% IV SOLN
INTRAVENOUS | Status: DC
  Administered 2016-12-10 – 2016-12-12 (×4): via INTRAVENOUS
  Filled 2016-12-10 (×4): qty 1000

## 2016-12-10 MED ORDER — SUGAMMADEX SODIUM 200 MG/2ML IV SOLN
INTRAVENOUS | Status: DC | PRN
  Administered 2016-12-10: 200 mg via INTRAVENOUS

## 2016-12-10 MED ORDER — SODIUM CHLORIDE 0.9 % IJ SOLN
INTRAMUSCULAR | Status: DC | PRN
  Administered 2016-12-10: 10 mL

## 2016-12-10 MED ORDER — LIDOCAINE 2% (20 MG/ML) 5 ML SYRINGE
INTRAMUSCULAR | Status: DC | PRN
  Administered 2016-12-10: 100 mg via INTRAVENOUS

## 2016-12-10 SURGICAL SUPPLY — 46 items
ADH SKN CLS APL DERMABOND .7 (GAUZE/BANDAGES/DRESSINGS) ×1
APPLIER CLIP ROT 10 11.4 M/L (STAPLE)
APR CLP MED LRG 11.4X10 (STAPLE)
CABLE HIGH FREQUENCY MONO STRZ (ELECTRODE) ×2 IMPLANT
CLIP APPLIE ROT 10 11.4 M/L (STAPLE) IMPLANT
COVER SURGICAL LIGHT HANDLE (MISCELLANEOUS) ×2 IMPLANT
DECANTER SPIKE VIAL GLASS SM (MISCELLANEOUS) ×2 IMPLANT
DERMABOND ADVANCED (GAUZE/BANDAGES/DRESSINGS) ×1
DERMABOND ADVANCED .7 DNX12 (GAUZE/BANDAGES/DRESSINGS) IMPLANT
DEVICE SUT QUICK LOAD TK 5 (STAPLE) ×7 IMPLANT
DEVICE SUT TI-KNOT TK 5X26 (MISCELLANEOUS) ×2 IMPLANT
DEVICE SUTURE ENDOST 10MM (ENDOMECHANICALS) ×2 IMPLANT
DISSECTOR BLUNT TIP ENDO 5MM (MISCELLANEOUS) ×2 IMPLANT
DRAIN PENROSE 18X1/2 LTX STRL (DRAIN) ×2 IMPLANT
DRAPE LAPAROSCOPIC ABDOMINAL (DRAPES) ×1 IMPLANT
ELECT REM PT RETURN 15FT ADLT (MISCELLANEOUS) ×2 IMPLANT
GLOVE BIOGEL M 8.0 STRL (GLOVE) ×2 IMPLANT
GOWN STRL REUS W/TWL XL LVL3 (GOWN DISPOSABLE) ×6 IMPLANT
GRASPER ENDO BABCOCK 10 (MISCELLANEOUS) IMPLANT
GRASPER ENDO BABCOCK 10MM (MISCELLANEOUS) ×2
KIT BASIN OR (CUSTOM PROCEDURE TRAY) ×2 IMPLANT
PAD POSITIONING PINK XL (MISCELLANEOUS) IMPLANT
POSITIONER SURGICAL ARM (MISCELLANEOUS) IMPLANT
RELOAD ENDO STITCH (ENDOMECHANICALS) ×6 IMPLANT
RELOAD SUT TRIPLE-STITCH 2-0 (ENDOMECHANICALS) ×3 IMPLANT
SCISSORS LAP 5X45 EPIX DISP (ENDOMECHANICALS) ×2 IMPLANT
SET IRRIG TUBING LAPAROSCOPIC (IRRIGATION / IRRIGATOR) ×1 IMPLANT
SHEARS HARMONIC ACE PLUS 45CM (MISCELLANEOUS) ×2 IMPLANT
SLEEVE ADV FIXATION 5X100MM (TROCAR) ×5 IMPLANT
STAPLER VISISTAT 35W (STAPLE) ×2 IMPLANT
SUT MNCRL AB 4-0 PS2 18 (SUTURE) ×2 IMPLANT
SUT SURGIDAC NAB ES-9 0 48 120 (SUTURE) ×6 IMPLANT
SUT VIC AB 4-0 SH 18 (SUTURE) ×2 IMPLANT
TAPE CLOTH 4X10 WHT NS (GAUZE/BANDAGES/DRESSINGS) IMPLANT
TIP INNERVISION DETACH 40FR (MISCELLANEOUS) IMPLANT
TIP INNERVISION DETACH 50FR (MISCELLANEOUS) ×1 IMPLANT
TIP INNERVISION DETACH 56FR (MISCELLANEOUS) IMPLANT
TIPS INNERVISION DETACH 40FR (MISCELLANEOUS)
TOWEL OR 17X26 10 PK STRL BLUE (TOWEL DISPOSABLE) ×4 IMPLANT
TOWEL OR NON WOVEN STRL DISP B (DISPOSABLE) ×2 IMPLANT
TRAY FOLEY CATH 14FR (SET/KITS/TRAYS/PACK) ×1 IMPLANT
TRAY LAPAROSCOPIC (CUSTOM PROCEDURE TRAY) ×2 IMPLANT
TROCAR ADV FIXATION 11X100MM (TROCAR) ×2 IMPLANT
TROCAR ADV FIXATION 5X100MM (TROCAR) ×2 IMPLANT
TROCAR BLADELESS OPT 5 100 (ENDOMECHANICALS) ×2 IMPLANT
TUBING INSUF HEATED (TUBING) ×2 IMPLANT

## 2016-12-10 NOTE — Op Note (Signed)
Surgeon: Kaylyn Lim, MD, FACS  Asst:  Jackolyn Confer, MD, FACS  Anes:  general  Preop Dx: Hiatal hernia with GER and history of [name]'s ulcers Postop Dx: same  Procedure: Laparoscopic repair of hiatal hernia with three posterior 0 Surgideks and Nissen fundoplication over 50 Fr lighted bougie with 3 sutures Location Surgery: [facility] OR 1 Complications: None noted  EBL:   5 cc  Drains: none  Description of Procedure:  The patient was taken to OR 1 .  After anesthesia was administered and the patient was prepped a timeout was performed.  Access achieved with 5 mm Optiview through the left upper quadrant.  Standard placement trocars including 12 to the right of the midline above the umbilicus.  Complicating the dissection was a large left lateral segment and the Stryker cameral which frequently would go dard.  The hernia was marked by a large fat pad that was herniated into the chest.  This was reduced and carefully resected with the Harmonic scalpel from the proximal stomach.  The dissection began on the right crus and the hernia had a lot of fat herniated posteriorly.  The right and left crura were identified.  The dissection was carried over anteriorly.  The short gastric vessels were taken down up the the left crus.  A penrose drain was placed and the esophageal dissection was completed to have 2-3 cm of esophagus in the abdomen without tension.  Three sutures of 0 Surgidek were placed posteriorly and tied extracorporeally with the [employer].  No 56 dilator was available.  Anesthesia was unable to pass the lighted bougie with the NG in place so it was passed alone.    The stomach was grasped from behind and brought around behind and a shoe shine maneuver performed.  The stomach stayed on the right without tension.  With the 50 Fr bougie in place the wrap was performed with three sutures using 2-0 Surgidek and TY knots on the top and a free tie on the lowermost suture.  All three sutures got  purchases on the anterior esophagus.  The bougie was withdrawn and the wrap appeared healthy and in good position.  No bleeding noted.  Abdomen was deflated and wound injected with Exparel and closed with 4-0 moncryl and Dermabond.    The patient tolerated the procedure well and was taken to the PACU in stable condition.     Matt B. Hassell Done, [place], [facility], [place]

## 2016-12-10 NOTE — Transfer of Care (Signed)
Immediate Anesthesia Transfer of Care Note  Patient: Rachel Edwards  Procedure(s) Performed: Procedure(s): LAPAROSCOPIC NISSEN FUNDOPLICATION (N/A)  Patient Location: PACU  Anesthesia Type:General  Level of Consciousness:  sedated, patient cooperative and responds to stimulation  Airway & Oxygen Therapy:Patient Spontanous Breathing and Patient connected to face mask oxgen  Post-op Assessment:  Report given to PACU RN and Post -op Vital signs reviewed and stable  Post vital signs:  Reviewed and stable  Last Vitals:  Vitals:   12/10/16 0719 12/10/16 1245  BP: (!) 135/91   Pulse: 96   Resp: 18   Temp: 36.7 C 77.8 C    Complications: No apparent anesthesia complications

## 2016-12-10 NOTE — Interval H&P Note (Signed)
History and Physical Interval Note:  12/10/2016 9:34 AM  Rachel Edwards  has presented today for surgery, with the diagnosis of cameron ulcers hiatal hernia   The various methods of treatment have been discussed with the patient and family. After consideration of risks, benefits and other options for treatment, the patient has consented to  Procedure(s): LAPAROSCOPIC NISSEN FUNDOPLICATION (N/A) as a surgical intervention .  The patient's history has been reviewed, patient examined, no change in status, stable for surgery.  I have reviewed the patient's chart and labs.  Questions were answered to the patient's satisfaction.     Kuuipo Anzaldo B

## 2016-12-10 NOTE — H&P (View-Only) (Signed)
Chief Complaint:  [name] and [name] ulcers  History of Present Illness:  Rachel Edwards is an 66 y.o. female who was seen in the office in late June with a history of anemia felt secondary to [facility] ulcers in a hiatal hernia.  Informed consent was obtained and she presents for surgery.    Past Medical History:  Diagnosis Date  . Anemia 08/06/2016  . Depression   . Hiatal hernia   . Hyperlipidemia   . Hypertension   . Panic attack   . Uterine fibroid   . Wears glasses     Past Surgical History:  Procedure Laterality Date  . ABDOMINAL HYSTERECTOMY  1998  . BREAST LUMPECTOMY     right june 2015  . BREAST LUMPECTOMY WITH RADIOACTIVE SEED LOCALIZATION Right 11/04/2013   Procedure: RIGHT BREAST LUMPECTOMY WITH RADIOACTIVE SEED LOCALIZATION;  Surgeon: Merrie Roof, MD;  Location: [place];  Service: General;  Laterality: Right;  . DILATION AND CURETTAGE OF UTERUS     x2  . ESOPHAGOGASTRODUODENOSCOPY (EGD) WITH PROPOFOL N/A 08/08/2016   Procedure: ESOPHAGOGASTRODUODENOSCOPY (EGD) WITH PROPOFOL;  Surgeon: Otis Brace, MD;  Location: [place];  Service: Gastroenterology;  Laterality: N/A;  . HERNIA REPAIR     umbil -age 62  . TONSILLECTOMY    . WISDOM TOOTH EXTRACTION      Current Outpatient Prescriptions  Medication Sig Dispense Refill  . albuterol (PROVENTIL HFA;VENTOLIN HFA) 108 (90 Base) MCG/ACT inhaler Inhale 2 puffs into the lungs every 6 (six) hours as needed for wheezing or shortness of breath.    Marland Kitchen aspirin 81 MG tablet Take 81 mg by mouth daily.    Marland Kitchen buPROPion (WELLBUTRIN XL) 300 MG 24 hr tablet Take 300 mg by mouth daily.     . busPIRone (BUSPAR) 15 MG tablet Take 15 mg by mouth 2 (two) times daily.     . calcium carbonate (OS-CAL) 600 MG TABS tablet Take 600 mg by mouth daily with breakfast.     . Calcium Polycarbophil (FIBER-CAPS PO) Take 1 capsule by mouth daily.     . cetirizine (ZYRTEC) 10 MG tablet Take 10 mg by mouth at bedtime.      . Chlorpheniramine Maleate (CHLOR-TRIMETON PO) Take 1 tablet by mouth daily.    . CRESTOR 10 MG tablet Take 10 mg by mouth at bedtime.     . ferrous sulfate 325 (65 FE) MG tablet Take 325 mg by mouth 2 (two) times daily with a meal.    . lisinopril (PRINIVIL,ZESTRIL) 5 MG tablet Take 5 mg by mouth daily.     . Multiple Vitamin (MULTIVITAMIN WITH MINERALS) TABS tablet Take 1 tablet by mouth daily.    Marland Kitchen MYRBETRIQ 50 MG TB24 tablet Take 50 mg by mouth daily.  12  . pantoprazole (PROTONIX) 40 MG tablet Take 1 tablet (40 mg total) by mouth daily. (Patient taking differently: Take 40 mg by mouth 2 (two) times daily. ) 30 tablet 0  . Probiotic Product (PROBIOTIC PO) Take 1 tablet by mouth daily.    . raloxifene (EVISTA) 60 MG tablet Take 1 tablet (60 mg total) by mouth daily. 90 tablet 1  . tamoxifen (NOLVADEX) 20 MG tablet Take 1 tablet (20 mg total) by mouth daily. 90 tablet 1  . venlafaxine XR (EFFEXOR-XR) 37.5 MG 24 hr capsule Take 37.5 mg by mouth at bedtime.    . vitamin E 400 UNIT capsule Take 400 Units by mouth daily.     No  current facility-administered medications for this visit.    Patient has no known allergies. Family History  Problem Relation Age of Onset  . Pneumonia Mother   . Heart attack Father   . Cancer Paternal Grandmother   . Cancer Maternal Grandfather    Social History:   reports that she has never smoked. She has never used smokeless tobacco. She reports that she drinks about 1.0 - 1.5 oz of alcohol per week . She reports that she does not use drugs.   REVIEW OF SYSTEMS : Negative except for see problem li9st  Physical Exam:   There were no vitals taken for this visit. There is no height or weight on file to calculate BMI.  Gen:  WDWN WF NAD  Neurological: Alert and oriented to person, place, and time. Motor and sensory function is grossly intact  Head: Normocephalic and atraumatic.  Eyes: Conjunctivae are normal. Pupils are equal, round, and reactive to light.  No scleral icterus.  Neck: Normal range of motion. Neck supple. No tracheal deviation or thyromegaly present.  Cardiovascular:  SR without murmurs or gallops.  No carotid bruits Breast:  Not examined  Respiratory: Effort normal.  No respiratory distress. No chest wall tenderness. Breath sounds normal.  No wheezes, rales or rhonchi.  Abdomen:  nontender GU:  Not examined Musculoskeletal: Normal range of motion. Extremities are nontender. No cyanosis, edema or clubbing noted Lymphadenopathy: No cervical, preauricular, postauricular or axillary adenopathy is present Skin: Skin is warm and dry. No rash noted. No diaphoresis. No erythema. No pallor. Pscyh: Normal mood and affect. Behavior is normal. Judgment and thought content normal.   LABORATORY RESULTS: No results found for this or any previous visit (from the past 48 hour(s)).   RADIOLOGY RESULTS: No results found.  Problem List: Patient Active Problem List   Diagnosis Date Noted  . Diastolic dysfunction without heart failure 08/08/2016  . Dilatation of aorta ([place]) 08/08/2016  . Chest pain 08/06/2016  . Symptomatic anemia 08/06/2016  . Fecal occult blood test positive 08/06/2016  . Situational depression 08/06/2016  . Anemia   . Status post total hysterectomy and bilateral salpingo-oophorectomy 02/24/2015  . High risk medication use 02/24/2015  . DCIS (ductal carcinoma in situ) of breast 02/24/2015  . Estrogen receptor positive status (ER+) 07/31/2014  . 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  . Obesity, Class I, BMI 30.0-34.9 (see actual BMI) 12/04/2013  . Breast cancer of lower-outer quadrant of right female breast ([place]) 10/09/2013    Assessment & Plan: GER, anemia and [name] ulcers with a hiatal hernia  Lap repair hiatal hernia and Nissen fundoplication    Matt B. Hassell Done, MD, [facility] [phone] beeper [phone]  12/09/2016 9:10 PM

## 2016-12-10 NOTE — Anesthesia Postprocedure Evaluation (Signed)
Anesthesia Post Note  Patient: [name]  Procedure(s) Performed: Procedure(s) (LRB): LAPAROSCOPIC NISSEN FUNDOPLICATION (N/A)     Patient location during evaluation: PACU Anesthesia Type: General Level of consciousness: awake and alert Pain management: pain level controlled Vital Signs Assessment: post-procedure vital signs reviewed and stable Respiratory status: spontaneous breathing, nonlabored ventilation, respiratory function stable and patient connected to nasal cannula oxygen Cardiovascular status: blood pressure returned to baseline and stable Postop Assessment: no signs of nausea or vomiting Anesthetic complications: no    Last Vitals:  Vitals:   12/10/16 1345 12/10/16 1400  BP: 129/87 (!) 145/94  Pulse: 94 97  Resp: 16 16  Temp:  36.5 C    Last Pain:  Vitals:   12/10/16 1405  TempSrc:   PainSc: 4                  Ahlijah Raia S

## 2016-12-10 NOTE — Anesthesia Preprocedure Evaluation (Signed)
Anesthesia Evaluation  Patient identified by MRN, date of birth, ID band Patient awake    Reviewed: Allergy & Precautions, NPO status , Patient's Chart, lab work & pertinent test results  Airway Mallampati: II  TM Distance: >3 FB Neck ROM: Full    Dental no notable dental hx.    Pulmonary neg pulmonary ROS,    Pulmonary exam normal breath sounds clear to auscultation       Cardiovascular hypertension, Normal cardiovascular exam Rhythm:Regular Rate:Normal     Neuro/Psych Anxiety negative neurological ROS     GI/Hepatic negative GI ROS, Neg liver ROS,   Endo/Other  negative endocrine ROS  Renal/GU negative Renal ROS  negative genitourinary   Musculoskeletal negative musculoskeletal ROS (+)   Abdominal   Peds negative pediatric ROS (+)  Hematology negative hematology ROS (+)   Anesthesia Other Findings   Reproductive/Obstetrics negative OB ROS                             Anesthesia Physical Anesthesia Plan  ASA: II  Anesthesia Plan: General   Post-op Pain Management:    Induction: Intravenous  PONV Risk Score and Plan: 1 and Ondansetron and Dexamethasone  Airway Management Planned: Oral ETT  Additional Equipment:   Intra-op Plan:   Post-operative Plan: Extubation in OR  Informed Consent: I have reviewed the patients History and Physical, chart, labs and discussed the procedure including the risks, benefits and alternatives for the proposed anesthesia with the patient or authorized representative who has indicated his/her understanding and acceptance.   Dental advisory given  Plan Discussed with: CRNA and Surgeon  Anesthesia Plan Comments:         Anesthesia Quick Evaluation

## 2016-12-11 DIAGNOSIS — K449 Diaphragmatic hernia without obstruction or gangrene: Secondary | ICD-10-CM | POA: Diagnosis not present

## 2016-12-11 LAB — BASIC METABOLIC PANEL
ANION GAP: 10 (ref 5–15)
BUN: 11 mg/dL (ref 6–20)
CO2: 24 mmol/L (ref 22–32)
Calcium: 8.9 mg/dL (ref 8.9–10.3)
Chloride: 106 mmol/L (ref 101–111)
Creatinine, Ser: 0.89 mg/dL (ref 0.44–1.00)
GFR calc Af Amer: >60 mL/min (ref >60)
GLUCOSE: 135 mg/dL — AB (ref 65–99)
POTASSIUM: 4 mmol/L (ref 3.5–5.1)
Sodium: 140 mmol/L (ref 135–145)

## 2016-12-11 LAB — CBC
HEMATOCRIT: 33.7 % — AB (ref 36.0–46.0)
HEMOGLOBIN: 11.3 g/dL — AB (ref 12.0–15.0)
MCH: 31 pg (ref 26.0–34.0)
MCHC: 33.5 g/dL (ref 30.0–36.0)
MCV: 92.3 fL (ref 78.0–100.0)
Platelets: 169 10*3/uL (ref 150–400)
RBC: 3.65 MIL/uL — AB (ref 3.87–5.11)
RDW: 12.5 % (ref 11.5–15.5)
WBC: 10.2 10*3/uL (ref 4.0–10.5)

## 2016-12-11 NOTE — Progress Notes (Signed)
Discharge planning, no HH needs identified. [phone] 

## 2016-12-11 NOTE — Plan of Care (Signed)
Problem: Food- and Nutrition-Related Knowledge Deficit (NB-1.1) Goal: Nutrition education Formal process to instruct or train a patient/client in a skill or to impart knowledge to help patients/clients voluntarily manage or modify food choices and eating behavior to maintain or improve health. Outcome: Completed/Met Date Met: 12/11/16 Nutrition Education Note  RD consulted for nutrition education regarding patient who is s/p nissen fundoplication.  RD provided handout on Nissen Fundoplication nutrition therapy. Reviewed clear and full liquids. Encouraged pt to avoid caffeine, carbonated beverages and use of straws.  Provided examples of appropriate foods on a soft diet. Reviewed gas producing foods. Discouraged intake of processed foods and red meat. Recommended use of a liquid multivitamin while following a liquid diet. Reviewed acceptable protein supplements.  RD discussed why it is important for patient to adhere to diet recommendations. Teach back method used.  Expect good compliance.   Body mass index is 30.85 kg/m.  Pt meets criteria for obesity based on current BMI.  Current diet order is clear liquids. PO intakes: 100%. Labs and medications reviewed. No further nutrition interventions warranted at this time. If additional nutrition issues arise, please re-consult RD.   Clayton Bibles, MS, RD, LDN Pager: [phone] After Hours Pager: [phone]

## 2016-12-11 NOTE — Progress Notes (Signed)
Patient ID: Rachel Edwards, female   DOB: Jul 27, 1950, 66 y.o.   MRN: [id number] [facility] Progress Note:   1 Day Post-Op  Subjective: Mental status is clear Objective: Vital signs in last 24 hours: Temp:  [97.7 F (36.5 C)-98.4 F (36.9 C)] 98.1 F (36.7 C) (07/17 0405) Pulse Rate:  [77-100] 77 (07/17 0405) Resp:  [13-16] 16 (07/17 0405) BP: (101-145)/(72-94) 119/77 (07/17 0405) SpO2:  [93 %-100 %] 97 % (07/17 0405)  Intake/Output from previous day: 07/16 0701 - 07/17 0700 In: 3720 [P.O.:220; I.V.:3500] Out: 1600 [Urine:1550; Blood:50] Intake/Output this shift: No intake/output data recorded.  Physical Exam: Work of breathing is normal.  Abdomen is sore as expected  Lab Results:  Results for orders placed or performed during the hospital encounter of 12/10/16 (from the past 48 hour(s))  CBC     Status: Abnormal   Collection Time: 12/10/16  3:38 PM  Result Value Ref Range   WBC 14.2 (H) 4.0 - 10.5 K/uL    Comment: WHITE COUNT CONFIRMED ON SMEAR   RBC 4.04 3.87 - 5.11 MIL/uL   Hemoglobin 12.9 12.0 - 15.0 g/dL   HCT 37.4 36.0 - 46.0 %   MCV 92.6 78.0 - 100.0 fL   MCH 31.9 26.0 - 34.0 pg   MCHC 34.5 30.0 - 36.0 g/dL   RDW 12.6 11.5 - 15.5 %   Platelets 142 (L) 150 - 400 K/uL    Comment: SPECIMEN CHECKED FOR CLOTS PLATELET COUNT CONFIRMED BY SMEAR   Creatinine, serum     Status: Abnormal   Collection Time: 12/10/16  3:38 PM  Result Value Ref Range   Creatinine, Ser 1.11 (H) 0.44 - 1.00 mg/dL   GFR calc non Af Amer 51 (L) >60 mL/min   GFR calc Af Amer 59 (L) >60 mL/min    Comment: (NOTE) The eGFR has been calculated using the CKD EPI equation. This calculation has not been validated in all clinical situations. eGFR's persistently <60 mL/min signify possible Chronic Kidney Disease.   CBC     Status: Abnormal   Collection Time: 12/11/16  5:28 AM  Result Value Ref Range   WBC 10.2 4.0 - 10.5 K/uL   RBC 3.65 (L) 3.87 - 5.11 MIL/uL   Hemoglobin 11.3 (L)  12.0 - 15.0 g/dL   HCT 33.7 (L) 36.0 - 46.0 %   MCV 92.3 78.0 - 100.0 fL   MCH 31.0 26.0 - 34.0 pg   MCHC 33.5 30.0 - 36.0 g/dL   RDW 12.5 11.5 - 15.5 %   Platelets 169 150 - 400 K/uL  Basic metabolic panel     Status: Abnormal   Collection Time: 12/11/16  5:28 AM  Result Value Ref Range   Sodium 140 135 - 145 mmol/L   Potassium 4.0 3.5 - 5.1 mmol/L   Chloride 106 101 - 111 mmol/L   CO2 24 22 - 32 mmol/L   Glucose, Bld 135 (H) 65 - 99 mg/dL   BUN 11 6 - 20 mg/dL   Creatinine, Ser 0.89 0.44 - 1.00 mg/dL   Calcium 8.9 8.9 - 10.3 mg/dL   GFR calc non Af Amer >60 >60 mL/min   GFR calc Af Amer >60 >60 mL/min    Comment: (NOTE) The eGFR has been calculated using the CKD EPI equation. This calculation has not been validated in all clinical situations. eGFR's persistently <60 mL/min signify possible Chronic Kidney Disease.    Anion gap 10 5 - 15    Radiology/Results:  No results found.  Anti-infectives: Anti-infectives    Start     Dose/Rate Route Frequency Ordered Stop   12/10/16 2200  cefoTEtan in Dextrose 5% (CEFOTAN) IVPB 2 g     2 g Intravenous Every 12 hours 12/10/16 1511 12/10/16 2145   12/10/16 0730  cefoTEtan in Dextrose 5% (CEFOTAN) IVPB 2 g     2 g Intravenous On call to O.R. 12/10/16 0719 12/10/16 0952      Assessment/Plan: Problem List: Patient Active Problem List   Diagnosis Date Noted  . Status post Nissen fundoplication July 6967 89/38/1017  . Status post laparoscopic Nissen fundoplication 51/06/5850  . Diastolic dysfunction without heart failure 08/08/2016  . Dilatation of aorta ([place]) 08/08/2016  . Chest pain 08/06/2016  . Symptomatic anemia 08/06/2016  . Fecal occult blood test positive 08/06/2016  . Situational depression 08/06/2016  . Anemia   . Status post total hysterectomy and bilateral salpingo-oophorectomy 02/24/2015  . High risk medication use 02/24/2015  . DCIS (ductal carcinoma in situ) of breast 02/24/2015  . Estrogen receptor positive  status (ER+) 07/31/2014  . 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  . Obesity, Class I, BMI 30.0-34.9 (see actual BMI) 12/04/2013  . Breast cancer of lower-outer quadrant of right female breast ([place]) 10/09/2013    Begin clear liquids 1 Day Post-Op    LOS: 1 day   Matt B. Hassell Done, MD,  [facility] [phone] beeper [phone]  12/11/2016 7:43 AM

## 2016-12-11 NOTE — Care Management Obs Status (Signed)
[facility] NOTIFICATION   Patient Details  Name: Rachel Edwards MRN: [id number] Date of Birth: March 03, 1951   Medicare Observation Status Notification Given:  Yes    Guadalupe Maple, RN 12/11/2016, 4:11 PM

## 2016-12-12 DIAGNOSIS — K449 Diaphragmatic hernia without obstruction or gangrene: Secondary | ICD-10-CM | POA: Diagnosis not present

## 2016-12-12 MED ORDER — OXYCODONE-ACETAMINOPHEN 5-325 MG PO TABS: 1.0000 | ORAL_TABLET | ORAL | 0 refills | Status: DC | PRN

## 2016-12-12 MED ORDER — ONDANSETRON 4 MG PO TBDP: 4.0000 mg | ORAL_TABLET | Freq: Four times a day (QID) | ORAL | 0 refills | Status: DC | PRN

## 2016-12-12 NOTE — Discharge Summary (Signed)
Physician Discharge Summary  Patient ID: Rachel Edwards MRN: [id number] DOB/AGE: 66-Aug-1952 66 y.o.  Admit date: 12/10/2016 Discharge date: 12/12/2016  Admission Diagnoses:  GERD and hiatal hernia with Rachel Edwards ulcers  Discharge Diagnoses:  same  Principal Problem:   Status post Nissen fundoplication July 7666 Active Problems:   Status post laparoscopic Nissen fundoplication   Surgery:  Nissen fundoplication and repair of hiatal herni  Discharged Condition: improved  Hospital Course:   Had surgery.  Begun on clears on PD 1.  Able to take enough to drink on PD 2 for discharge.  Instructions given  Consults: none  Significant Diagnostic Studies: none    Discharge Exam: Blood pressure 129/88, pulse 77, temperature 97.6 F (36.4 C), temperature source Oral, resp. rate 16, height 5\' 5"  (1.651 m), weight 84.1 kg (185 lb 6 oz), SpO2 96 %. Incisions OK.  Disposition: 01-Home or Self Care  Discharge Instructions    Call MD for:  persistant nausea and vomiting    Complete by:  As directed    Call MD for:  severe uncontrolled pain    Complete by:  As directed    Discharge instructions    Complete by:  As directed    Liquids for a week and then pureed foods for 3 weeks.  Then advance solid diet slowly adding doughy breads and meats last.   Increase activity slowly    Complete by:  As directed    No dressing needed    Complete by:  As directed      Allergies as of 12/12/2016   No Known Allergies     Medication List    STOP taking these medications   ferrous sulfate 325 (65 FE) MG tablet     TAKE these medications   albuterol 108 (90 Base) MCG/ACT inhaler Commonly known as:  PROVENTIL HFA;VENTOLIN HFA Inhale 2 puffs into the lungs every 6 (six) hours as needed for wheezing or shortness of breath.   aspirin 81 MG tablet Take 81 mg by mouth daily.   buPROPion 300 MG 24 hr tablet Commonly known as:  WELLBUTRIN XL Take 300 mg by mouth daily.   busPIRone 15 MG  tablet Commonly known as:  BUSPAR Take 15 mg by mouth 2 (two) times daily.   calcium carbonate 600 MG Tabs tablet Commonly known as:  OS-CAL Take 600 mg by mouth daily with breakfast.   cetirizine 10 MG tablet Commonly known as:  ZYRTEC Take 10 mg by mouth at bedtime.   CHLOR-TRIMETON PO Take 1 tablet by mouth daily.   CRESTOR 10 MG tablet Generic drug:  rosuvastatin Take 10 mg by mouth at bedtime.   FIBER-CAPS PO Take 1 capsule by mouth daily.   lisinopril 5 MG tablet Commonly known as:  PRINIVIL,ZESTRIL Take 5 mg by mouth daily.   multivitamin with minerals Tabs tablet Take 1 tablet by mouth daily.   MYRBETRIQ 50 MG Tb24 tablet Generic drug:  mirabegron ER Take 50 mg by mouth daily.   ondansetron 4 MG disintegrating tablet Commonly known as:  ZOFRAN-ODT Take 1 tablet (4 mg total) by mouth every 6 (six) hours as needed for nausea.   oxyCODONE-acetaminophen 5-325 MG tablet Commonly known as:  PERCOCET/ROXICET Take 1-2 tablets by mouth every 4 (four) hours as needed for moderate pain.   pantoprazole 40 MG tablet Commonly known as:  PROTONIX Take 1 tablet (40 mg total) by mouth daily. What changed:  when to take this   PROBIOTIC PO Take 1 tablet  by mouth daily.   raloxifene 60 MG tablet Commonly known as:  EVISTA Take 1 tablet (60 mg total) by mouth daily.   tamoxifen 20 MG tablet Commonly known as:  NOLVADEX Take 1 tablet (20 mg total) by mouth daily.   venlafaxine XR 37.5 MG 24 hr capsule Commonly known as:  EFFEXOR-XR Take 37.5 mg by mouth at bedtime.   vitamin E 400 UNIT capsule Take 400 Units by mouth daily.      Follow-up Information    Rachel Hausen, MD Follow up.   Specialty:  General Surgery Why:  Make appointment when you return from [facility] information: [address] [place] [address] [phone]           Signed: Pedro Edwards 12/12/2016, 8:41 AM

## 2016-12-12 NOTE — Discharge Instructions (Signed)
Laparoscopic Nissen Fundoplication, Care After °Refer to this sheet in the next few weeks. These instructions provide you with information about caring for yourself after your procedure. Your health care provider may also give you more specific instructions. Your treatment has been planned according to current medical practices, but problems sometimes occur. Call your health care provider if you have any problems or questions after your procedure. °What can I expect after the procedure? °After the procedure, it is common to have: °· Difficulty swallowing (dysphagia). °· Excess gas (bloating). ° °Follow these instructions at home: °Medicines °· Take medicines only as directed by your health care provider. °· Do not drive or operate heavy machinery while taking pain medicine. °Incision care °· There are many different ways to close and cover an incision, including stitches (sutures), skin glue, and adhesive strips. Follow your health care provider's instructions about: °? Incision care. °? Bandage (dressing) changes and removal. °? Incision closure removal. °· Check your incision areas every day for signs of infection. Watch for: °? Redness, swelling, or pain. °? Fluid, blood, or pus. °· Do not take baths, swim, or use a hot tub until your health care provider approves. Take showers as directed by your health care provider. °Eating and drinking °· Follow your health care provider's instructions about eating. °? You may need to follow a liquid-only diet for 2 weeks, followed by a diet of soft foods for 2 weeks. °? You should return to your usual diet gradually. °· Drink enough fluid to keep your urine clear or pale yellow. °Activity °· Return to your normal activities as directed by your health care provider. Ask your health care provider what activities are safe for you. °· Avoid strenuous exercise. °· Do not lift anything that is heavier than 10 lb (4.5 kg). °· Ask your health care provider when you can: °? Return to  sexual activity. °? Drive. °? Go back to work. °Contact a health care provider if: °· You have a fever. °· Your pain gets worse or is not helped by medicine. °· You have frequent nausea or vomiting. °· You have continued abdominal bloating. °· You have an ongoing (persistent) cough. °· You have redness, swelling, or pain in any incision areas. °· You have fluid, blood, or pus coming from any incisions. °Get help right away if: °· You have trouble breathing. °· You are unable to swallow. °· You have persistent vomiting. °· You have blood in your vomit. °· You have severe abdominal pain. °This information is not intended to replace advice given to you by your health care provider. Make sure you discuss any questions you have with your health care provider. °Document Released: 01/05/2004 Document Revised: 10/20/2015 Document Reviewed: 01/13/2014 °Elsevier Interactive Patient Education © 2018 [employer] ° °

## 2016-12-12 NOTE — Progress Notes (Signed)
Discharge instructions discussed with patient and family, verbalized agreement and understanding, prescription given to patient 

## 2016-12-19 ENCOUNTER — Ambulatory Visit (HOSPITAL_COMMUNITY): Payer: Medicare Other

## 2016-12-24 ENCOUNTER — Ambulatory Visit (HOSPITAL_COMMUNITY)
Admission: RE | Admit: 2016-12-24 | Discharge: 2016-12-24 | Disposition: A | Discharge status: Home / Self Care | Payer: Medicare Other | Source: Ambulatory Visit | Attending: Surgery | Admitting: Surgery

## 2016-12-24 ENCOUNTER — Encounter (HOSPITAL_COMMUNITY): Payer: Self-pay

## 2017-04-11 ENCOUNTER — Other Ambulatory Visit: Payer: Medicare Other

## 2017-04-11 ENCOUNTER — Encounter: Payer: Medicare Other | Admitting: Hematology

## 2017-04-13 ENCOUNTER — Telehealth: Payer: Self-pay | Admitting: Hematology

## 2017-04-13 NOTE — Telephone Encounter (Signed)
Scheduled appt per 11/15 sch msg - spoke with patient regarding this appt.

## 2017-04-13 NOTE — Progress Notes (Signed)
This encounter was created in error - please disregard.

## 2017-05-13 NOTE — Progress Notes (Signed)
Rachel Edwards  [phone] Fax:([phone]) [phone]  Clinic Progress Note   Patient Care Team: Antony Contras, MD as PCP - General (Family Medicine)   Date of Service:  05/14/2017  CHIEF COMPLAINTS:  Follow up of right breast DCIS  ONCOLOGIC HISTORY 1.  She had diagnostic right mammogram at [facility] on 10-01-13, which confirmed 7 mm cluster of calcifications right lower outer quadrant.  2. Stereotactic biopsy at [facility] 10-06-2013 [id number]) showed intermediate grade DCIS 33mm, ER 100%, PR 100%.  3. She had lumpectomy with radioactive seed localization by Dr Marlou Starks 11-04-2013, with no residual DCIS in the specimen ([id number]).  3. patient has elected no radiation therapy.  4. She began tamoxifen 12-05-2013.  HISTORY OF PRESENTING ILLNESS (10/11/16):  Rachel Edwards 66 y.o. female is a former patient of Dr. Marko Edwards. This is my first encounter with her.  The patient was hospitalized from 08/06/16 - 08/08/16 for anemia and chest pain. The patient received a blood transfusion and underwent endoscopy showing 5 cm hiatal hernia and [name] ulcers. She has been taking oral iron supplement since then.  She here today to establish care. She reports she is doing well. She is taking Effexor for hot flashes, which are manageable. Patient performs occasional self breast exams.    CURRENT THERAPY: Tamoxifen started 12/05/13, stopped 10/11/16 due to possible drug interactions with Wellbutrin, and switched to Raloxifene 60mg  daily in 09/2016. Plan to complete in 11/2018.   INTERVAL HISTORY  Rachel Edwards is here for a follow up. She was last seen by me 6 months ago. She presents to the clinic today noting she is still taking Raloxifene. She denies joint pain, her hot flashes are being managed and denies any other issues. She notes she had a hiatal hernia surgery and her surgeon said she had an enlarged liver. Her 07/2016 CT AP showed fatty liver. She will follow up with her  PCP about this.     MEDICAL HISTORY:  Past Medical History:  Diagnosis Date  . Anemia 08/06/2016  . Depression   . Hiatal hernia   . Hyperlipidemia   . Hypertension   . Panic attack   . Uterine fibroid   . Wears glasses     SURGICAL HISTORY: Past Surgical History:  Procedure Laterality Date  . ABDOMINAL HYSTERECTOMY  1998  . BREAST LUMPECTOMY     right june 2015  . BREAST LUMPECTOMY WITH RADIOACTIVE SEED LOCALIZATION Right 11/04/2013   Procedure: RIGHT BREAST LUMPECTOMY WITH RADIOACTIVE SEED LOCALIZATION;  Surgeon: Merrie Roof, MD;  Location: [facility];  Service: General;  Laterality: Right;  . DILATION AND CURETTAGE OF UTERUS     x2  . ESOPHAGOGASTRODUODENOSCOPY (EGD) WITH PROPOFOL N/A 08/08/2016   Procedure: ESOPHAGOGASTRODUODENOSCOPY (EGD) WITH PROPOFOL;  Surgeon: Otis Brace, MD;  Location: [facility];  Service: Gastroenterology;  Laterality: N/A;  . HERNIA REPAIR     umbil -age 41  . LAPAROSCOPIC NISSEN FUNDOPLICATION N/A 4/65/0354   Procedure: LAPAROSCOPIC NISSEN FUNDOPLICATION;  Surgeon: Johnathan Hausen, MD;  Location: [facility];  Service: General;  Laterality: N/A;  . TONSILLECTOMY    . WISDOM TOOTH EXTRACTION      SOCIAL HISTORY: Social History   Socioeconomic History  . Marital status: Married    Spouse name: Not on file  . Number of children: Not on file  . Years of education: Not on file  . Highest education level: Not on file  Social Needs  . Financial resource strain: Not  on file  . Food insecurity - worry: Not on file  . Food insecurity - inability: Not on file  . Transportation needs - medical: Not on file  . Transportation needs - non-medical: Not on file  Occupational History  . Not on file  Tobacco Use  . Smoking status: Never Smoker  . Smokeless tobacco: Never Used  Substance and Sexual Activity  . Alcohol use: Yes    Alcohol/week: 1.0 - 1.5 oz    Types: 2 - 3 Standard drinks or equivalent per week    Comment:  OCCASIONAL  . Drug use: No  . Sexual activity: Not on file  Other Topics Concern  . Not on file  Social History Narrative  . Not on file    FAMILY HISTORY: Family History  Problem Relation Age of Onset  . Pneumonia Mother   . Heart attack Father   . Cancer Paternal Grandmother   . Cancer Maternal Grandfather     ALLERGIES:  has No Known Allergies.  MEDICATIONS:  Current Outpatient Medications  Medication Sig Dispense Refill  . albuterol (PROVENTIL HFA;VENTOLIN HFA) 108 (90 Base) MCG/ACT inhaler Inhale 2 puffs into the lungs every 6 (six) hours as needed for wheezing or shortness of breath.    Marland Kitchen aspirin 81 MG tablet Take 81 mg by mouth daily.    Marland Kitchen buPROPion (WELLBUTRIN XL) 300 MG 24 hr tablet Take 300 mg by mouth daily.     . busPIRone (BUSPAR) 15 MG tablet Take 15 mg by mouth 2 (two) times daily.     . calcium carbonate (OS-CAL) 600 MG TABS tablet Take 600 mg by mouth daily with breakfast.     . Calcium Polycarbophil (FIBER-CAPS PO) Take 1 capsule by mouth daily.     . cetirizine (ZYRTEC) 10 MG tablet Take 10 mg by mouth at bedtime.     . Chlorpheniramine Maleate (CHLOR-TRIMETON PO) Take 1 tablet by mouth daily.    . CRESTOR 10 MG tablet Take 10 mg by mouth at bedtime.     Marland Kitchen lisinopril (PRINIVIL,ZESTRIL) 5 MG tablet Take 5 mg by mouth daily.     . Multiple Vitamin (MULTIVITAMIN WITH MINERALS) TABS tablet Take 1 tablet by mouth daily.    Marland Kitchen MYRBETRIQ 50 MG TB24 tablet Take 50 mg by mouth daily.  12  . ondansetron (ZOFRAN-ODT) 4 MG disintegrating tablet Take 1 tablet (4 mg total) by mouth every 6 (six) hours as needed for nausea. 25 tablet 0  . oxyCODONE-acetaminophen (PERCOCET/ROXICET) 5-325 MG tablet Take 1-2 tablets by mouth every 4 (four) hours as needed for moderate pain. 30 tablet 0  . pantoprazole (PROTONIX) 40 MG tablet Take 1 tablet (40 mg total) by mouth daily. (Patient taking differently: Take 40 mg by mouth 2 (two) times daily. ) 30 tablet 0  . Probiotic Product  (PROBIOTIC PO) Take 1 tablet by mouth daily.    . raloxifene (EVISTA) 60 MG tablet Take 1 tablet (60 mg total) by mouth daily. 90 tablet 1  . tamoxifen (NOLVADEX) 20 MG tablet Take 1 tablet (20 mg total) by mouth daily. 90 tablet 1  . venlafaxine XR (EFFEXOR-XR) 37.5 MG 24 hr capsule Take 37.5 mg by mouth at bedtime.    . vitamin E 400 UNIT capsule Take 400 Units by mouth daily.     No current facility-administered medications for this visit.     REVIEW OF SYSTEMS:   Constitutional: Denies fevers, chills or abnormal night sweats (+) manageable hot flashes Eyes: Denies blurriness  of vision, double vision or watery eyes Ears, nose, mouth, throat, and face: Denies mucositis or sore throat Respiratory: Denies cough, dyspnea or wheezes Cardiovascular: Denies palpitation, chest discomfort or lower extremity swelling Gastrointestinal:  Denies nausea, heartburn or change in bowel habits Skin: Denies abnormal skin rashes Lymphatics: Denies new lymphadenopathy or easy bruising Neurological:Denies numbness, tingling or new weaknesses Behavioral/Psych: Mood is stable, no new changes  All other systems were reviewed with the patient and are negative.  PHYSICAL EXAMINATION: ECOG PERFORMANCE STATUS: 0 - Asymptomatic  Vitals:   05/14/17 1541  BP: 125/74  Pulse: (!) 109  Resp: 20  Temp: 98.5 F (36.9 C)  SpO2: 97%   Filed Weights   05/14/17 1541  Weight: 183 lb 8 oz (83.2 kg)    GENERAL:alert, no distress and comfortable SKIN: skin color, texture, turgor are normal, no rashes or significant lesions EYES: normal, conjunctiva are pink and non-injected, sclera clear OROPHARYNX:no exudate, no erythema and lips, buccal mucosa, and tongue normal  NECK: supple, thyroid normal size, non-tender, without nodularity LYMPH:  no palpable lymphadenopathy in the cervical, axillary or inguinal LUNGS: clear to auscultation and percussion with normal breathing effort HEART: regular rate & rhythm and no  murmurs and no lower extremity edema ABDOMEN:abdomen soft, non-tender and normal bowel sounds Musculoskeletal:no cyanosis of digits and no clubbing  PSYCH: alert & oriented x 3 with fluent speech NEURO: no focal motor/sensory deficits Breast exam reveals surgical incision is well healed, no palpable mass or adenopathy.    LABORATORY DATA:  I have reviewed the data as listed CBC Latest Ref Rng & Units 05/14/2017 12/11/2016 12/10/2016  WBC 3.9 - 10.3 10e3/uL 6.8 10.2 14.2(H)  Hemoglobin 11.6 - 15.9 g/dL 12.5 11.3(L) 12.9  Hematocrit 34.8 - 46.6 % 37.6 33.7(L) 37.4  Platelets 145 - 400 10e3/uL 216 169 142(L)    PATHOLOGY: Diagnosis 08/08/16 1. Small Intestine Biopsy PEPTIC DUODENITIS NEGATIVE FOR VILLOUS ATROPHY OR DYSPLASIA 2. Stomach, biopsy CHRONIC GASTRITIS NO HELICOBACTER PYLORI ORGANISMS ARE IDENTIFIED (WARTHIN-STARR STAIN) NEGATIVE FOR ATYPIA OR MALIGNANCY Zhaol  Diagnosis 11/04/13 1. Breast, lumpectomy, right - BENIGN BREAST PARENCHYMA. - THERE IS NO EVIDENCE OF MALIGNANCY. - SEE ONCOLOGY TABLE BELOW. 2. Breast, excision, superior and medial margins - BENIGN BREAST PARENCHYMA. - THERE IS NO EVIDENCE OF MALIGNANCY. - SEE COMMENT. Microscopic Comment 1. BREAST, IN SITU CARCINOMA Breast: In situ carcinoma, right (based on previous needle core [id number]). Specimen, including laterality: Right breast. Procedure (include lymph node sampling sentinel-non-sentinel: Core needle biopsy, lumpectomy, and additional superior and medial margin resection. Grade of carcinoma: Intermediate grade. Necrosis: Present. Estimated tumor size: (glass slide measurement): 0.2 cm Treatment effect: N/A Distance to closest margin: Greater than 0.2 cm to all margins. Breast prognostic profile: Case [id number]. Estrogen receptor: 100%, strong staining intensity. Progesterone receptor: 100%, strong staining intensity. Lymph nodes: Not examined. TNM: pTis, pNX Comments: Histologic  evaluation of the current specimen reveals benign breast parenchyma with fibrocystic changes and healing biopsy site. Ductal carcinoma in situ is not identified in the current specimen. Review of the patient's previous core biopsy ([id number]), does confirm the presence of intermediate grade ductal carcinoma in situ with associated calcifications. (JBK:ecj 11/09/2013) 2. The surgical resection margins have been inked and microscopically evaluated. 1 of 2 FINAL for KEILEY, LEVEY ([id number]) Microscopic Comment(continued) Enid Cutter MD Pathologist, Electronic Signature (Case signed 11/09/2013) Specimen Gross and Clinical Information Specimen(s) Obtained: 1. Breast, lumpectomy, right 2. Breast, excision, superior and medial margins Specimen Clinical Information 1. 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 Specimen type: Right breast lumpectomy, in formalin at 1522 hours. Size: 6.2 x 5 x 2.8 cm. Orientation: The specimen is previously inked as follows, green anterior, blue inferior, orange lateral, yellow medial, black posterior, red superior. Localized area: There is an inserted pin, localizing a radioactive seed implant. Cut surface: On sectioning through the area with the pin, a radioactive seed implant is identified and sent to nuclear medicine. Also within this area is a 0.6 cm in diameter cavity containing red-brown soft material and a metallic clip. Surrounding the cavity is a 1.7 x 1.5 x 1.3 cm area of gray-white to yellow-red indurated, ill-defined tissue. Margins: The area of indurated tissue surrounding the cavity comes within 0.3 cm of the superior and medial margins, 0.5 cm of the posterior margin and greater than 1 cm from the remaining margins. Prognostic indicators: Not taken at the time of gross. Block summary: A - C = cavity/tissue with clip, and nearest medial and superior margins. D, E = posterior margin nearest tissue with clip. F = anterior  margin nearest tissue with clip. G = inferior margin nearest tissue with clip. H = lateral margin nearest tissue with clip. Total, eight blocks. 2. Received fresh is a 4.5 x 4.2 cm irregular portion of fibrofatty tissue, which is up to 1 cm thick, and clinically identified as new superior and medial margins of right breast. There is a short single suture at superior edge, long single suture at medial edge, and dull suture on the new true surgical margin. The new margin is inked black. On sectioning, there is predominantly soft fatty tissue with minimal gray-white soft fibrous tissue. Representative sections are submitted as follows: A, B = superior. C, E = inferior. F, G = medial. H - J = lateral. Total, ten blocks. (SSW:ecj 11/05/2013)  Diagnosis 10/06/13 Breast, right, needle core biopsy, right lower outer quadrant - DUCTAL CARCINOMA IN SITU WITH CALCIFICATIONS. Microscopic Comment There is intermediate grade ductal carcinoma in situ with calcifications. Estrogen and progesterone receptors will be performed. 1 of 2 FINAL for Lyster, Shakeya T [phone]) Microscopic Comment(continued) Dr. Lyndon Code agrees.  RADIOGRAPHIC STUDIES: I have personally reviewed the radiological images as listed and agreed with the findings in the report. No results found.   Diagnostic Mammogram Bilateral 11/06/16  IMPRESSION: No evidence of malignancy within either breast. Stable postsurgical changes within the right breast. RECOMMENDATION: Bilateral diagnostic mammogram in 1 year.   CT A/P 08/06/16 IMPRESSION: 1. No cause for the patient's anemia identified. 2. Minimal atherosclerotic change in the abdominal aorta and branching vessels. 3. Moderate hiatal hernia.  Bilateral Breast TOMO and Right Breast US 10/04/15 IMPRESSION: 1.  Stable right breast lumpectomy site. 2. There is a benign cyst in the inferior right breast at 6 o'clock. 3.  No mammographic evidence of malignancy in the bilateral  breasts.  Bilateral Breast TOMO 09/27/14 IMPRESSION: No evidence of malignancy.  Bilateral Breast MRI W WO Contrast 10/13/13 IMPRESSION: Post biopsy change associated with the lower outer quadrant of the right breast. No worrisome areas of enhancement within either breast and no evidence for adenopathy. Incidental hiatal hernia noted.  MM Digital Diagnostic Right Breast 10/01/13 IMPRESSION: Suspicious right breast calcifications, lower outer quadrant. Stereotactic core needle biopsy is recommended. This will be scheduled at the patient's convenience.  ASSESSMENT & PLAN:  66 y.o.  woman with:  1. Intermediate grade DCIS of right breast, ER/PR positive, spanning 2 mm - I have reviewed her medical records, and comfortable and keep  findings with patient.  -Patient underwent lumpectomy previously; surgical margins were negative. -Her DCIS was cured. Any further treatment is preventative. - Patient is post hysterectomy and bilateral salpingo oophorectomy in 1998. - Patient began Tamoxifen 12/05/13, and plans to continue for 5 years. The patient experienced some hot flashes with Tamoxifen, but managed with Effexor. Tamoxifen was stopped 10/11/16 due to possible drug interactions with Wellbutrin, switched to Raloxifene 60mg  daily. Plan to complete in 11/2018, she is tolerating well  -I reviewed her 08/06/16 CT AP that reports to her having a fatty liver. Her hiatal hernia surgeon notes she had a mildly enlarged liver. I discussed that the enlargement is likely related to her fatty liver. I recommend she see her PCP about this and watch her cholesterol.  -She is clinically doing well. Lab reviewed, her CBC and CMP are within normal limits. Her physical exam and her 10/2016 mammogram were unremarkable. There is no clinical concern for recurrence. -Continue Raloxifene -Next mammogram in 10/2017 -interim will see her PCP every 6 months and will be seen by her GYN  -F/u in 1 year    2. Anemia  - Patient was  anemic during hospitalization in March 2018. - The patient began taking iron supplements following discharge. - Labs reviewed today, Hgb is 13.2 on 10/11/16. Anemia resolved. - Continue oral iron. -Today's iron study is pending.   3. HTN - Patient is on BP medication. - She will continue to follow with her PCP for management. - Patient will stay active and maintain a healthy diet and adequate hydration.  4. Bone [place] -bone density scan was previously done by Dr. Nori Riis  -Will repeat in 10/2017   Plan: -Lab and f/u in one year  -interim will see her PCP every 6 months and will be seen by her GYN  -Mammogram and DEXA in 10/2017 at [facility], ordered today     Orders Placed This Encounter  Procedures  . MM DIAG BREAST TOMO BILATERAL    Standing Status:   Future    Standing Expiration Date:   05/14/2018    Order Specific Question:   Reason for Exam (SYMPTOM  OR DIAGNOSIS REQUIRED)    Answer:   screening    Order Specific Question:   Preferred imaging location?    Answer:   [facility]  . DG Bone Density    Standing Status:   Future    Standing Expiration Date:   05/14/2018    Order Specific Question:   Reason for Exam (SYMPTOM  OR DIAGNOSIS REQUIRED)    Answer:   screening    Order Specific Question:   Preferred imaging location?    Answer:   [facility]    All questions were answered. The patient knows to call the clinic with any problems, questions or concerns.  I spent 20 minutes counseling the patient face to face. The total time spent in the appointment was 25 minutes and more than 50% was on counseling.  This document serves as a record of services personally performed by Truitt Merle, MD. It was created on her behalf by Joslyn Devon, a trained medical scribe. The creation of this record is based on the scribe's personal observations and the provider's statements to them.    I have reviewed the above documentation for accuracy and completeness, and I agree with the  above.    Truitt Merle, MD 05/14/2017

## 2017-05-14 ENCOUNTER — Other Ambulatory Visit (HOSPITAL_BASED_OUTPATIENT_CLINIC_OR_DEPARTMENT_OTHER): Payer: Medicare Other

## 2017-05-14 ENCOUNTER — Ambulatory Visit (HOSPITAL_BASED_OUTPATIENT_CLINIC_OR_DEPARTMENT_OTHER): Payer: Medicare Other | Admitting: Hematology

## 2017-05-14 VITALS — BP 125/74 | HR 109 | Temp 98.5°F | Resp 20 | Wt 183.5 lb

## 2017-05-14 DIAGNOSIS — Z170 Estrogen receptor positive status [ER+]: Secondary | ICD-10-CM | POA: Diagnosis not present

## 2017-05-14 DIAGNOSIS — K760 Fatty (change of) liver, not elsewhere classified: Secondary | ICD-10-CM

## 2017-05-14 DIAGNOSIS — D649 Anemia, unspecified: Secondary | ICD-10-CM

## 2017-05-14 DIAGNOSIS — E2839 Other primary ovarian failure: Secondary | ICD-10-CM

## 2017-05-14 DIAGNOSIS — D0511 Intraductal carcinoma in situ of right breast: Secondary | ICD-10-CM

## 2017-05-14 DIAGNOSIS — N951 Menopausal and female climacteric states: Secondary | ICD-10-CM | POA: Diagnosis not present

## 2017-05-14 DIAGNOSIS — I10 Essential (primary) hypertension: Secondary | ICD-10-CM | POA: Diagnosis not present

## 2017-05-14 LAB — CBC WITH DIFFERENTIAL/PLATELET
BASO%: 0.3 % (ref 0.0–2.0)
BASOS ABS: 0 10*3/uL (ref 0.0–0.1)
EOS ABS: 0.1 10*3/uL (ref 0.0–0.5)
EOS%: 1.3 % (ref 0.0–7.0)
HEMATOCRIT: 37.6 % (ref 34.8–46.6)
HEMOGLOBIN: 12.5 g/dL (ref 11.6–15.9)
LYMPH#: 2.1 10*3/uL (ref 0.9–3.3)
LYMPH%: 30.8 % (ref 14.0–49.7)
MCH: 31.6 pg (ref 25.1–34.0)
MCHC: 33.2 g/dL (ref 31.5–36.0)
MCV: 94.9 fL (ref 79.5–101.0)
MONO#: 0.4 10*3/uL (ref 0.1–0.9)
MONO%: 5.4 % (ref 0.0–14.0)
NEUT#: 4.2 10*3/uL (ref 1.5–6.5)
NEUT%: 62.2 % (ref 38.4–76.8)
PLATELETS: 216 10*3/uL (ref 145–400)
RBC: 3.96 10*6/uL (ref 3.70–5.45)
RDW: 12.8 % (ref 11.2–14.5)
WBC: 6.8 10*3/uL (ref 3.9–10.3)

## 2017-05-14 LAB — COMPREHENSIVE METABOLIC PANEL
ALT: 30 U/L (ref 0–55)
ANION GAP: 11 meq/L (ref 3–11)
AST: 40 U/L — ABNORMAL HIGH (ref 5–34)
Albumin: 4.3 g/dL (ref 3.5–5.0)
Alkaline Phosphatase: 93 U/L (ref 40–150)
BUN: 9.3 mg/dL (ref 7.0–26.0)
CHLORIDE: 105 meq/L (ref 98–109)
CO2: 24 meq/L (ref 22–29)
Calcium: 9.6 mg/dL (ref 8.4–10.4)
Creatinine: 0.9 mg/dL (ref 0.6–1.1)
GLUCOSE: 110 mg/dL (ref 70–140)
Potassium: 3.8 mEq/L (ref 3.5–5.1)
SODIUM: 141 meq/L (ref 136–145)
TOTAL PROTEIN: 7.2 g/dL (ref 6.4–8.3)
Total Bilirubin: 0.55 mg/dL (ref 0.20–1.20)

## 2017-05-14 MED ORDER — RALOXIFENE HCL 60 MG PO TABS: 60.0000 mg | ORAL_TABLET | Freq: Every day | ORAL | 1 refills | Status: DC

## 2017-05-15 ENCOUNTER — Telehealth: Payer: Self-pay | Admitting: Hematology

## 2017-05-15 ENCOUNTER — Encounter: Payer: Self-pay | Admitting: Hematology

## 2017-05-15 LAB — IRON AND TIBC
%SAT: 25 % (ref 21–57)
IRON: 83 ug/dL (ref 41–142)
TIBC: 333 ug/dL (ref 236–444)
UIBC: 250 ug/dL (ref 120–384)

## 2017-05-15 LAB — FERRITIN: FERRITIN: 102 ng/mL (ref 9–269)

## 2017-05-15 NOTE — Telephone Encounter (Signed)
Spoke to patient regarding December 2019 appointments.

## 2017-05-27 ENCOUNTER — Telehealth: Payer: Self-pay | Admitting: *Deleted

## 2017-05-27 NOTE — Telephone Encounter (Signed)
-----   Message from Truitt Merle, MD sent at 05/25/2017  1:45 PM EST ----- Please let pt know her iron study on her last visit was normal, thanks.   Truitt Merle  05/25/2017

## 2017-05-27 NOTE — Telephone Encounter (Signed)
TCT patient regarding lab work from 05/14/17. Spoke with patient and informed her that her iron studies were normal. Pt voiced understanding and had no questions or concerns.

## 2017-08-17 ENCOUNTER — Other Ambulatory Visit: Payer: Self-pay | Admitting: Hematology

## 2017-08-17 DIAGNOSIS — D0511 Intraductal carcinoma in situ of right breast: Secondary | ICD-10-CM

## 2017-08-19 ENCOUNTER — Other Ambulatory Visit: Payer: Self-pay | Admitting: Family Medicine

## 2017-08-19 DIAGNOSIS — I77810 Thoracic aortic ectasia: Secondary | ICD-10-CM

## 2017-08-26 ENCOUNTER — Telehealth: Payer: Self-pay | Admitting: Cardiovascular Disease

## 2017-08-26 NOTE — Telephone Encounter (Signed)
New Message  Pt states that at her last appt she was told to f/u with a ct in 2 yrs but states that her pcp said it should be 1 yr. Please call

## 2017-08-26 NOTE — Telephone Encounter (Signed)
Called patient back. Informed her that per Dr. Kyla Balzarine last office visit (10/2016) patient needs a CT in 2 years. Informed patient she would be due for OV in June. Went ahead and made an appointment for [place]. Informed patient that she could discuss CT with Dr. Johnsie Cancel at her appointment. Patient verbalized understanding.

## 2017-10-31 NOTE — Progress Notes (Signed)
Cardiology Office Note   Date:  11/11/2017   ID:  Rachel Edwards, DOB [birth year], MRN [id number]  PCP:  Antony Contras, MD  Cardiologist:   Jenkins Rouge, MD   No chief complaint on file.    History of Present Illness: Rachel Edwards is a 67 y.o. female f/u chest pain  Seen in consult by Dr Debara Pickett 08/07/16 For chest pain and tachycardia. History of depression, HTN, HLD , panic attacks and breast cancer. Anemic On admission with Hb 9 Received 2 units of blood. R/O MI  Father died of MI in his 20's and brother died suddenly in his sleep in his 84's   Echo reviewed:  08/07/16: no valve disease EF 55 - 60% grade one diastolic dysfunction Aortic Root 3.7 cm  Root 4.1 by CT    EGD showed gastric ulcers and hiatal hernia   Myovue : 11/29/16 normal with no ischemic EF 72%  Has apple watch and I discussed its ECG feature to monitor her HR     Past Medical History:  Diagnosis Date  . Anemia 08/06/2016  . Breast cancer of lower-outer quadrant of right female breast ([place]) 10/09/2013  . Chest pain 08/06/2016  . DCIS (ductal carcinoma in situ) of breast 02/24/2015  . Depression   . Diastolic dysfunction without heart failure 08/08/2016  . Dilatation of aorta ([place]) 08/08/2016  . 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  . Estrogen receptor positive status (ER+) 07/31/2014  . Fecal occult blood test positive 08/06/2016  . Hiatal hernia   . High risk medication use 02/24/2015  . Hyperlipidemia   . Hypertension   . Obesity, Class I, BMI 30.0-34.9 (see actual BMI) 12/04/2013  . Panic attack   . Situational depression 08/06/2016  . Status post total hysterectomy and bilateral salpingo-oophorectomy 02/24/2015  . Symptomatic anemia 08/06/2016  . Uterine fibroid   . Wears glasses     Past Surgical History:  Procedure Laterality Date  . ABDOMINAL HYSTERECTOMY  1998  . BREAST LUMPECTOMY     right june 2015  . BREAST LUMPECTOMY WITH RADIOACTIVE SEED LOCALIZATION Right 11/04/2013     Procedure: RIGHT BREAST LUMPECTOMY WITH RADIOACTIVE SEED LOCALIZATION;  Surgeon: Merrie Roof, MD;  Location: [facility];  Service: General;  Laterality: Right;  . DILATION AND CURETTAGE OF UTERUS     x2  . ESOPHAGOGASTRODUODENOSCOPY (EGD) WITH PROPOFOL N/A 08/08/2016   Procedure: ESOPHAGOGASTRODUODENOSCOPY (EGD) WITH PROPOFOL;  Surgeon: Otis Brace, MD;  Location: [facility];  Service: Gastroenterology;  Laterality: N/A;  . HERNIA REPAIR     umbil -age 82  . LAPAROSCOPIC NISSEN FUNDOPLICATION N/A 7/42/5956   Procedure: LAPAROSCOPIC NISSEN FUNDOPLICATION;  Surgeon: Johnathan Hausen, MD;  Location: [facility];  Service: General;  Laterality: N/A;  . TONSILLECTOMY    . WISDOM TOOTH EXTRACTION       Current Outpatient Medications  Medication Sig Dispense Refill  . albuterol (PROVENTIL HFA;VENTOLIN HFA) 108 (90 Base) MCG/ACT inhaler Inhale 2 puffs into the lungs every 6 (six) hours as needed for wheezing or shortness of breath.    Marland Kitchen aspirin 81 MG tablet Take 81 mg by mouth daily.    Marland Kitchen buPROPion (WELLBUTRIN XL) 150 MG 24 hr tablet Take 300 mg by mouth daily.     . busPIRone (BUSPAR) 15 MG tablet Take 15 mg by mouth 2 (two) times daily.     . calcium carbonate (OS-CAL) 600 MG TABS tablet Take 600 mg by mouth daily  with breakfast.     . Calcium Polycarbophil (FIBER-CAPS PO) Take 1 capsule by mouth daily.     . cetirizine (ZYRTEC) 10 MG tablet Take 10 mg by mouth at bedtime.     . Chlorpheniramine Maleate (CHLOR-TRIMETON PO) Take 1 tablet by mouth daily.    . CRESTOR 10 MG tablet Take 10 mg by mouth at bedtime.     . fluticasone (FLONASE) 50 MCG/ACT nasal spray Place into both nostrils as directed.    Marland Kitchen lisinopril (PRINIVIL,ZESTRIL) 5 MG tablet Take 5 mg by mouth daily.     . meloxicam (MOBIC) 15 MG tablet Take 15 mg by mouth as directed.    . Multiple Vitamin (MULTIVITAMIN WITH MINERALS) TABS tablet Take 1 tablet by mouth daily.    Marland Kitchen MYRBETRIQ 50 MG TB24 tablet Take 50 mg  by mouth daily.  12  . pantoprazole (PROTONIX) 40 MG tablet Take 40 mg by mouth as directed.    . Probiotic Product (PROBIOTIC PO) Take 1 tablet by mouth daily.    . raloxifene (EVISTA) 60 MG tablet TAKE 1 TABLET(60 MG) BY MOUTH DAILY 90 tablet 0  . terbinafine (LAMISIL) 1 % cream Apply 1 application topically as directed.    . venlafaxine XR (EFFEXOR-XR) 37.5 MG 24 hr capsule Take 37.5 mg by mouth at bedtime.    . vitamin E 400 UNIT capsule Take 400 Units by mouth daily.     No current facility-administered medications for this visit.     Allergies:   Patient has no known allergies.    Social History:  The patient  reports that she has never smoked. She has never used smokeless tobacco. She reports that she drinks about 1.2 - 1.8 oz of alcohol per week. She reports that she does not use drugs.   Family History:  The patient's family history includes Cancer in her maternal grandfather and paternal grandmother; Heart attack in her father; Pneumonia in her mother.    ROS:  Please see the history of present illness.   Otherwise, review of systems are positive for none.   All other systems are reviewed and negative.    PHYSICAL EXAM: VS:  BP 124/78   Pulse 82   Ht 5\' 5"  (1.651 m)   Wt 182 lb 8 oz (82.8 kg)   SpO2 98%   BMI 30.37 kg/m  , BMI Body mass index is 30.37 kg/m. Affect appropriate Healthy:  appears stated age 33: normal Neck supple with no adenopathy JVP normal no bruits no thyromegaly Lungs clear with no wheezing and good diaphragmatic motion Heart:  S1/S2 no murmur, no rub, gallop or click PMI normal Abdomen: benighn, BS positve, no tenderness, no AAA no bruit.  No HSM or HJR Distal pulses intact with no bruits No edema Neuro non-focal Skin warm and dry No muscular weakness    EKG:  ST rate 103 low voltage poor R wave progression nonspecific ST changes  11/11/17 SR rate 82 PAC no acute changes   Recent Labs: 05/14/2017: ALT 30; BUN 9.3; Creatinine 0.9;  HGB 12.5; Platelets 216; Potassium 3.8; Sodium 141    Lipid Panel No results found for: CHOL, TRIG, HDL, CHOLHDL, VLDL, LDLCALC, LDLDIRECT    Wt Readings from Last 3 Encounters:  11/11/17 182 lb 8 oz (82.8 kg)  05/14/17 183 lb 8 oz (83.2 kg)  12/10/16 185 lb 6 oz (84.1 kg)      Other studies Reviewed: Additional studies/ records that were reviewed today include: Hospital records from  March Echo consult note Dr Debara Pickett and EGD .    ASSESSMENT AND PLAN:  1. Chest pain/Family history of CAD: normal myovue 11/29/16  thought to be from GI etiology 2. Gastric Ulcer/Anemia improved f/u GI continue protonix 3. Tachycardia  Relative some due to anemia and anxiety and multiple antidepressant meds TSHT4 Normal in March observe no need for monitor 4. Abnormal ECG low voltage poor R wave progression likely from body habitus echo normal EF 5. Dilated aortic root only 3. 7 by echo march 2018 f/u CT at discretion of primary  6. Cholesterol  Continue statin labs with Dr Moreen Fowler  7. HTN  Well controlled.  Continue current medications and low sodium Dash type diet.   8. Depression should simplify medication f/u primary     Current medicines are reviewed at length with the patient today.  The patient does not have concerns regarding medicines.  The following changes have been made:  no change  Labs/ tests ordered today include: None   Orders Placed This Encounter  Procedures  . EKG 12-Lead     Disposition:   FU with cardiology prn     Signed, Jenkins Rouge, MD  11/11/2017 8:54 AM    [facility] [address], [place], [place]  [address] Phone: [phone]; Fax: [phone]

## 2017-11-11 ENCOUNTER — Encounter: Payer: Self-pay | Admitting: Cardiovascular Disease

## 2017-11-11 ENCOUNTER — Ambulatory Visit: Payer: Medicare Other | Admitting: Cardiovascular Disease

## 2017-11-11 VITALS — BP 124/78 | HR 82 | Ht 65.0 in | Wt 182.5 lb

## 2017-11-11 DIAGNOSIS — E785 Hyperlipidemia, unspecified: Secondary | ICD-10-CM | POA: Diagnosis not present

## 2017-11-11 DIAGNOSIS — I10 Essential (primary) hypertension: Secondary | ICD-10-CM | POA: Diagnosis not present

## 2017-11-11 DIAGNOSIS — R079 Chest pain, unspecified: Secondary | ICD-10-CM

## 2017-11-11 DIAGNOSIS — R000 Tachycardia, unspecified: Secondary | ICD-10-CM

## 2017-11-11 DIAGNOSIS — I77810 Thoracic aortic ectasia: Secondary | ICD-10-CM | POA: Diagnosis not present

## 2017-11-11 DIAGNOSIS — Z8249 Family history of ischemic heart disease and other diseases of the circulatory system: Secondary | ICD-10-CM | POA: Diagnosis not present

## 2017-11-11 NOTE — Patient Instructions (Addendum)
Medication Instructions:  Your physician recommends that you continue on your current medications as directed. Please refer to the Current Medication list given to you today.  Labwork: NONE  Testing/Procedures: NONE  Follow-Up: Your physician wants you to follow-up as needed with  Dr. Nishan.    If you need a refill on your cardiac medications before your next appointment, please call your pharmacy.    

## 2017-12-11 ENCOUNTER — Inpatient Hospital Stay: Admission: RE | Admit: 2017-12-11 | Payer: Medicare Other | Source: Ambulatory Visit

## 2017-12-11 ENCOUNTER — Other Ambulatory Visit: Payer: Medicare Other

## 2018-01-06 ENCOUNTER — Ambulatory Visit
Admission: RE | Admit: 2018-01-06 | Discharge: 2018-01-06 | Disposition: A | Discharge status: Home / Self Care | Payer: Medicare Other | Source: Ambulatory Visit | Attending: Hematology | Admitting: Hematology

## 2018-01-06 DIAGNOSIS — D0511 Intraductal carcinoma in situ of right breast: Secondary | ICD-10-CM

## 2018-01-06 HISTORY — DX: Malignant neoplasm of unspecified site of unspecified female breast: C50.919

## 2018-03-11 ENCOUNTER — Other Ambulatory Visit: Payer: Self-pay | Admitting: Hematology

## 2018-03-11 DIAGNOSIS — D0511 Intraductal carcinoma in situ of right breast: Secondary | ICD-10-CM

## 2018-05-14 ENCOUNTER — Ambulatory Visit: Payer: Medicare Other | Admitting: Hematology

## 2018-05-14 ENCOUNTER — Other Ambulatory Visit: Payer: Medicare Other

## 2018-05-14 ENCOUNTER — Telehealth: Payer: Self-pay | Admitting: Hematology

## 2018-05-14 NOTE — Telephone Encounter (Signed)
Tried to reach regarding voicemail I did leave a message °

## 2018-06-04 NOTE — Progress Notes (Signed)
Rachel Edwards   [phone] [phone]   Clinic Follow up Note   Patient Care Team: Antony Contras, MD as PCP - General (Family Medicine) Josue Hector, MD as PCP - Cardiology (Cardiology) 06/05/2018  CHIEF COMPLAINT: F/u on right breast DCIS   CURRENT THERAPY Raloxifene 60mg  daily started in 09/2016   INTERVAL HISTORY: Rachel Edwards is a 68 y.o. female who is here for follow-up. Since our last visit, she had a mammogram in 12/2017 which was benign. Today, she is here alone. She is doing well. She is tolerating Raloxifene well with mild hot flashes.     Pertinent positives and negatives of review of systems are listed and detailed within the above HPI.  REVIEW OF SYSTEMS:   Constitutional: Denies fevers, chills or abnormal weight loss (+) mild hot flashes  Eyes: Denies blurriness of vision Ears, nose, mouth, throat, and face: Denies mucositis or sore throat Respiratory: Denies cough, dyspnea or wheezes Cardiovascular: Denies palpitation, chest discomfort or lower extremity swelling Gastrointestinal:  Denies nausea, heartburn or change in bowel habits Skin: Denies abnormal skin rashes Lymphatics: Denies new lymphadenopathy or easy bruising Neurological:Denies numbness, tingling or new weaknesses Behavioral/Psych: Mood is stable, no new changes  All other systems were reviewed with the patient and are negative.  MEDICAL HISTORY:  Past Medical History:  Diagnosis Date  . Anemia 08/06/2016  . Breast cancer ([place])   . Breast cancer of lower-outer quadrant of right female breast ([place]) 10/09/2013  . Chest pain 08/06/2016  . DCIS (ductal carcinoma in situ) of breast 02/24/2015  . Depression   . Diastolic dysfunction without heart failure 08/08/2016  . Dilatation of aorta ([place]) 08/08/2016  . 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  . Estrogen receptor positive status (ER+) 07/31/2014  . Fecal occult blood test positive 08/06/2016  .  Hiatal hernia   . High risk medication use 02/24/2015  . Hyperlipidemia   . Hypertension   . Obesity, Class I, BMI 30.0-34.9 (see actual BMI) 12/04/2013  . Panic attack   . Situational depression 08/06/2016  . Status post total hysterectomy and bilateral salpingo-oophorectomy 02/24/2015  . Symptomatic anemia 08/06/2016  . Uterine fibroid   . Wears glasses     SURGICAL HISTORY: Past Surgical History:  Procedure Laterality Date  . ABDOMINAL HYSTERECTOMY  1998  . BREAST BIOPSY    . BREAST LUMPECTOMY     right june 2015  . BREAST LUMPECTOMY WITH RADIOACTIVE SEED LOCALIZATION Right 11/04/2013   Procedure: RIGHT BREAST LUMPECTOMY WITH RADIOACTIVE SEED LOCALIZATION;  Surgeon: Merrie Roof, MD;  Location: [facility];  Service: General;  Laterality: Right;  . DILATION AND CURETTAGE OF UTERUS     x2  . ESOPHAGOGASTRODUODENOSCOPY (EGD) WITH PROPOFOL N/A 08/08/2016   Procedure: ESOPHAGOGASTRODUODENOSCOPY (EGD) WITH PROPOFOL;  Surgeon: Otis Brace, MD;  Location: [facility];  Service: Gastroenterology;  Laterality: N/A;  . HERNIA REPAIR     umbil -age 5  . LAPAROSCOPIC NISSEN FUNDOPLICATION N/A 3/53/2992   Procedure: LAPAROSCOPIC NISSEN FUNDOPLICATION;  Surgeon: Johnathan Hausen, MD;  Location: [facility];  Service: General;  Laterality: N/A;  . TONSILLECTOMY    . WISDOM TOOTH EXTRACTION      I have reviewed the social history and family history with the patient and they are unchanged from previous note.  ALLERGIES:  has No Known Allergies.  MEDICATIONS:  Current Outpatient Medications  Medication Sig Dispense Refill  . albuterol (PROVENTIL HFA;VENTOLIN HFA) 108 (90 Base)  MCG/ACT inhaler Inhale 2 puffs into the lungs every 6 (six) hours as needed for wheezing or shortness of breath.    Marland Kitchen aspirin 81 MG tablet Take 81 mg by mouth daily.    Marland Kitchen buPROPion (WELLBUTRIN XL) 150 MG 24 hr tablet Take 300 mg by mouth daily.     . busPIRone (BUSPAR) 15 MG tablet Take 15 mg by mouth 2  (two) times daily.     . calcium carbonate (OS-CAL) 600 MG TABS tablet Take 600 mg by mouth daily with breakfast.     . Calcium Polycarbophil (FIBER-CAPS PO) Take 1 capsule by mouth daily.     . cetirizine (ZYRTEC) 10 MG tablet Take 10 mg by mouth at bedtime.     . Chlorpheniramine Maleate (CHLOR-TRIMETON PO) Take 1 tablet by mouth daily.    . CRESTOR 10 MG tablet Take 10 mg by mouth at bedtime.     . fluticasone (FLONASE) 50 MCG/ACT nasal spray Place into both nostrils as directed.    Marland Kitchen lisinopril (PRINIVIL,ZESTRIL) 5 MG tablet Take 5 mg by mouth daily.     . Multiple Vitamin (MULTIVITAMIN WITH MINERALS) TABS tablet Take 1 tablet by mouth daily.    Marland Kitchen MYRBETRIQ 50 MG TB24 tablet Take 50 mg by mouth daily.  12  . pantoprazole (PROTONIX) 40 MG tablet Take 40 mg by mouth as directed.    . Probiotic Product (PROBIOTIC PO) Take 1 tablet by mouth daily.    . raloxifene (EVISTA) 60 MG tablet Take 1 tablet (60 mg total) by mouth daily. 90 tablet 2  . terbinafine (LAMISIL) 1 % cream Apply 1 application topically as directed.     No current facility-administered medications for this visit.     PHYSICAL EXAMINATION: ECOG PERFORMANCE STATUS: 0 - Asymptomatic  Vitals:   06/05/18 1314 06/05/18 1317  BP: 135/75 136/82  Pulse: 81   Resp: 18   Temp: 97.9 F (36.6 C)   SpO2: 98%    Filed Weights   06/05/18 1314  Weight: 183 lb 1.6 oz (83.1 kg)    GENERAL:alert, no distress and comfortable SKIN: skin color, texture, turgor are normal, no rashes or significant lesions EYES: normal, Conjunctiva are pink and non-injected, sclera clear OROPHARYNX:no exudate, no erythema and lips, buccal mucosa, and tongue normal  NECK: supple, thyroid normal size, non-tender, without nodularity LYMPH:  no palpable lymphadenopathy in the cervical, axillary or inguinal LUNGS: clear to auscultation and percussion with normal breathing effort HEART: regular rate & rhythm and no murmurs and no lower extremity  edema ABDOMEN:abdomen soft, non-tender and normal bowel sounds. No organomegaly  Musculoskeletal:no cyanosis of digits and no clubbing  NEURO: alert & oriented x 3 with fluent speech, no focal motor/sensory deficits BREAST: No new palpable masses or skin or nipple changes. (+) surgical incision healed well with no numbness   LABORATORY DATA:  I have reviewed the data as listed CBC Latest Ref Rng & Units 06/05/2018 05/14/2017 12/11/2016  WBC 4.0 - 10.5 K/uL 6.2 6.8 10.2  Hemoglobin 12.0 - 15.0 g/dL 12.1 12.5 11.3(L)  Hematocrit 36.0 - 46.0 % 36.7 37.6 33.7(L)  Platelets 150 - 400 K/uL 187 216 169     CMP Latest Ref Rng & Units 06/05/2018 05/14/2017 12/11/2016  Glucose 70 - 99 mg/dL 90 110 135(H)  BUN 8 - 23 mg/dL 8 9.3 11  Creatinine 0.44 - 1.00 mg/dL 0.78 0.9 0.89  Sodium 135 - 145 mmol/L 142 141 140  Potassium 3.5 - 5.1 mmol/L 4.0 3.8  4.0  Chloride 98 - 111 mmol/L 107 - 106  CO2 22 - 32 mmol/L 27 24 24   Calcium 8.9 - 10.3 mg/dL 9.3 9.6 8.9  Total Protein 6.5 - 8.1 g/dL 6.8 7.2 -  Total Bilirubin 0.3 - 1.2 mg/dL 0.5 0.55 -  Alkaline Phos 38 - 126 U/L 98 93 -  AST 15 - 41 U/L 26 40(H) -  ALT 0 - 44 U/L 22 30 -      RADIOGRAPHIC STUDIES: I have personally reviewed the radiological images as listed and agreed with the findings in the report. No results found.   01/06/2018 Mammogram IMPRESSION: No mammographic evidence of malignancy.  ASSESSMENT & PLAN:  Rachel Edwards is a 68 y.o. female with history of  1. DCIS of right breast, Intermediate grade  ER/PR positive -Diagnosed in 2015. Treated with lumpectomy and antihormonal therapy. Tamoxifen was started on 12/05/2013, but stopped on 10/11/2016 due to risk of interaction with Wellbutrin. Raloxifen was started in 09/2016. Tolerating well. -She will completed 5 years of antiestrogen therapy in July 2020. -I reviewed and discussed her 2019 mammogram results. No evidence of breast cancer.  -Labs reviewed, CBC and CMP are WNLs.  She  is clinically doing well, asymptomatic, exam was unremarkable.  No concern for breast cancer. -f/u in 8 months  2. HTN -f/u with PCP and continue medications  3.  Hepatic steatosis -I advised her to monitor her cholesterol. She is currently on medications. -I advised her to monitor her fatty liver with [place] and f/u with GI to monitor for progression to cirrhosis -I also recommend healthy diet, exercise, and try to loose some weight    Plan  -I refilled Raloxifene, she will complete in July 2020  -continue surveillance  -f/u in 8 months  No problem-specific Assessment & Plan notes found for this encounter.   No orders of the defined types were placed in this encounter.  All questions were answered. The patient knows to call the clinic with any problems, questions or concerns. No barriers to learning was detected. I spent 15 minutes counseling the patient face to face. The total time spent in the appointment was 20 minutes and more than 50% was on counseling and review of test results  I, Noor Dweik am acting as scribe for Dr. Truitt Merle.  I have reviewed the above documentation for accuracy and completeness, and I agree with the above.     Truitt Merle, MD 06/05/2018

## 2018-06-05 ENCOUNTER — Encounter: Payer: Self-pay | Admitting: Hematology

## 2018-06-05 ENCOUNTER — Inpatient Hospital Stay (HOSPITAL_BASED_OUTPATIENT_CLINIC_OR_DEPARTMENT_OTHER): Payer: Medicare Other | Admitting: Hematology

## 2018-06-05 ENCOUNTER — Telehealth: Payer: Self-pay | Admitting: Hematology

## 2018-06-05 ENCOUNTER — Inpatient Hospital Stay: Payer: Medicare Other | Attending: Hematology

## 2018-06-05 VITALS — BP 136/82 | HR 81 | Temp 97.9°F | Resp 18 | Ht 65.0 in | Wt 183.1 lb

## 2018-06-05 DIAGNOSIS — N951 Menopausal and female climacteric states: Secondary | ICD-10-CM

## 2018-06-05 DIAGNOSIS — D0511 Intraductal carcinoma in situ of right breast: Secondary | ICD-10-CM | POA: Diagnosis not present

## 2018-06-05 DIAGNOSIS — Z79810 Long term (current) use of selective estrogen receptor modulators (SERMs): Secondary | ICD-10-CM | POA: Diagnosis not present

## 2018-06-05 DIAGNOSIS — K760 Fatty (change of) liver, not elsewhere classified: Secondary | ICD-10-CM | POA: Diagnosis not present

## 2018-06-05 DIAGNOSIS — I10 Essential (primary) hypertension: Secondary | ICD-10-CM | POA: Insufficient documentation

## 2018-06-05 DIAGNOSIS — I951 Orthostatic hypotension: Secondary | ICD-10-CM | POA: Insufficient documentation

## 2018-06-05 DIAGNOSIS — Z170 Estrogen receptor positive status [ER+]: Secondary | ICD-10-CM | POA: Diagnosis not present

## 2018-06-05 LAB — CBC WITH DIFFERENTIAL/PLATELET
ABS IMMATURE GRANULOCYTES: 0.01 10*3/uL (ref 0.00–0.07)
Basophils Absolute: 0 10*3/uL (ref 0.0–0.1)
Basophils Relative: 1 %
Eosinophils Absolute: 0.1 10*3/uL (ref 0.0–0.5)
Eosinophils Relative: 1 %
HEMATOCRIT: 36.7 % (ref 36.0–46.0)
Hemoglobin: 12.1 g/dL (ref 12.0–15.0)
Immature Granulocytes: 0 %
LYMPHS ABS: 2 10*3/uL (ref 0.7–4.0)
Lymphocytes Relative: 33 %
MCH: 31.3 pg (ref 26.0–34.0)
MCHC: 33 g/dL (ref 30.0–36.0)
MCV: 94.8 fL (ref 80.0–100.0)
MONO ABS: 0.5 10*3/uL (ref 0.1–1.0)
MONOS PCT: 8 %
NEUTROS ABS: 3.6 10*3/uL (ref 1.7–7.7)
Neutrophils Relative %: 57 %
PLATELETS: 187 10*3/uL (ref 150–400)
RBC: 3.87 MIL/uL (ref 3.87–5.11)
RDW: 12.5 % (ref 11.5–15.5)
WBC: 6.2 10*3/uL (ref 4.0–10.5)
nRBC: 0 % (ref 0.0–0.2)

## 2018-06-05 LAB — COMPREHENSIVE METABOLIC PANEL
ALBUMIN: 4 g/dL (ref 3.5–5.0)
ALT: 22 U/L (ref 0–44)
ANION GAP: 8 (ref 5–15)
AST: 26 U/L (ref 15–41)
Alkaline Phosphatase: 98 U/L (ref 38–126)
BILIRUBIN TOTAL: 0.5 mg/dL (ref 0.3–1.2)
BUN: 8 mg/dL (ref 8–23)
CALCIUM: 9.3 mg/dL (ref 8.9–10.3)
CHLORIDE: 107 mmol/L (ref 98–111)
CO2: 27 mmol/L (ref 22–32)
Creatinine, Ser: 0.78 mg/dL (ref 0.44–1.00)
GFR calc Af Amer: >60 mL/min (ref >60)
GFR calc non Af Amer: >60 mL/min (ref >60)
Glucose, Bld: 90 mg/dL (ref 70–99)
POTASSIUM: 4 mmol/L (ref 3.5–5.1)
SODIUM: 142 mmol/L (ref 135–145)
Total Protein: 6.8 g/dL (ref 6.5–8.1)

## 2018-06-05 MED ORDER — RALOXIFENE HCL 60 MG PO TABS: 60.0000 mg | ORAL_TABLET | Freq: Every day | ORAL | 2 refills | Status: DC

## 2018-06-05 NOTE — Telephone Encounter (Signed)
Printed calendar and avs. °

## 2018-12-05 ENCOUNTER — Other Ambulatory Visit: Payer: Self-pay | Admitting: Hematology

## 2018-12-05 DIAGNOSIS — Z9889 Other specified postprocedural states: Secondary | ICD-10-CM

## 2019-01-09 ENCOUNTER — Other Ambulatory Visit: Payer: Self-pay

## 2019-01-09 ENCOUNTER — Ambulatory Visit
Admission: RE | Admit: 2019-01-09 | Discharge: 2019-01-09 | Disposition: A | Discharge status: Home / Self Care | Payer: Medicare Other | Source: Ambulatory Visit | Attending: Hematology | Admitting: Hematology

## 2019-01-09 DIAGNOSIS — Z9889 Other specified postprocedural states: Secondary | ICD-10-CM

## 2019-01-30 NOTE — Progress Notes (Signed)
[place]   [phone] [phone]   Clinic Follow up Note   Patient Care Team: Antony Contras, MD as PCP - General (Family Medicine) Josue Hector, MD as PCP - Cardiology (Cardiology)  Date of Service:  02/05/2019  CHIEF COMPLAINT: F/u on right breast DCIS  Oncology History  Ductal carcinoma in situ (DCIS) of right breast  10/01/2013 Mammogram   Mammogram 10/01/13 IMPRESSION: Suspicious right breast calcifications, lower outer quadrant. Stereotactic core needle biopsy is recommended. This will be scheduled at the patient's convenience. Estrogen Receptor: 100%, POSITIVE, STRONG STAINING INTENSITY Progesterone Receptor: 100%, POSITIVE, STRONG STAINING INTENSITY   10/06/2013 Initial Biopsy   Diagnosis 10/06/13 Breast, right, needle core biopsy, right lower outer quadrant - DUCTAL CARCINOMA IN SITU WITH CALCIFICATIONS. Estrogen Receptor: 100%, POSITIVE, STRONG STAINING INTENSITY Progesterone Receptor: 100%, POSITIVE, STRONG STAINING INTENSITY   11/04/2013 Surgery   Right lumpecomty 11/04/13 Diagnosis 1. Breast, lumpectomy, right - BENIGN BREAST PARENCHYMA. - THERE IS NO EVIDENCE OF MALIGNANCY. - SEE ONCOLOGY TABLE BELOW. 2. Breast, excision, superior and medial margins - BENIGN BREAST PARENCHYMA. - THERE IS NO EVIDENCE OF MALIGNANCY. - SEE COMMENT.   12/05/2013 - 01/2019 Anti-estrogen oral therapy   Tamoxifen was started on 12/05/2013, but stopped on 10/11/2016 due to risk of interaction with Wellbutrin. Raloxifen was started in 09/2016. She completed in 01/2019   2015 Initial Diagnosis   Ductal carcinoma in situ (DCIS) of right breast     CURRENT THERAPY:  Surveillance    INTERVAL HISTORY:  Rachel Edwards is here for a follow up right breast DCIS. She was last seen by me 8 months ago. She presents to the clinic alone. She notes she is doing well. She notes her Gyn visit was well and so was her recent mammogram. She notes she is  tolerating Raloxifene and is ready to stop.    REVIEW OF SYSTEMS:   Constitutional: Denies fevers, chills or abnormal weight loss Eyes: Denies blurriness of vision Ears, nose, mouth, throat, and face: Denies mucositis or sore throat Respiratory: Denies cough, dyspnea or wheezes Cardiovascular: Denies palpitation, chest discomfort or lower extremity swelling Gastrointestinal:  Denies nausea, heartburn or change in bowel habits Skin: Denies abnormal skin rashes Lymphatics: Denies new lymphadenopathy or easy bruising Neurological:Denies numbness, tingling or new weaknesses Behavioral/Psych: Mood is stable, no new changes  All other systems were reviewed with the patient and are negative.  MEDICAL HISTORY:  Past Medical History:  Diagnosis Date  . Anemia 08/06/2016  . Breast cancer ([place])   . Breast cancer of lower-outer quadrant of right female breast ([place]) 10/09/2013  . Chest pain 08/06/2016  . DCIS (ductal carcinoma in situ) of breast 02/24/2015  . Depression   . Diastolic dysfunction without heart failure 08/08/2016  . Dilatation of aorta ([place]) 08/08/2016  . 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  . Estrogen receptor positive status (ER+) 07/31/2014  . Fecal occult blood test positive 08/06/2016  . Hiatal hernia   . High risk medication use 02/24/2015  . Hyperlipidemia   . Hypertension   . Obesity, Class I, BMI 30.0-34.9 (see actual BMI) 12/04/2013  . Panic attack   . Situational depression 08/06/2016  . Status post total hysterectomy and bilateral salpingo-oophorectomy 02/24/2015  . Symptomatic anemia 08/06/2016  . Uterine fibroid   . Wears glasses     SURGICAL HISTORY: Past Surgical History:  Procedure Laterality Date  . ABDOMINAL HYSTERECTOMY  1998  . BREAST BIOPSY    .  BREAST LUMPECTOMY     right june 2015  . BREAST LUMPECTOMY WITH RADIOACTIVE SEED LOCALIZATION Right 11/04/2013   Procedure: RIGHT BREAST LUMPECTOMY WITH RADIOACTIVE SEED LOCALIZATION;  Surgeon:  Merrie Roof, MD;  Location: [facility];  Service: General;  Laterality: Right;  . DILATION AND CURETTAGE OF UTERUS     x2  . ESOPHAGOGASTRODUODENOSCOPY (EGD) WITH PROPOFOL N/A 08/08/2016   Procedure: ESOPHAGOGASTRODUODENOSCOPY (EGD) WITH PROPOFOL;  Surgeon: Otis Brace, MD;  Location: [place];  Service: Gastroenterology;  Laterality: N/A;  . HERNIA REPAIR     umbil -age 35  . LAPAROSCOPIC NISSEN FUNDOPLICATION N/A [id number]   Procedure: LAPAROSCOPIC NISSEN FUNDOPLICATION;  Surgeon: Johnathan Hausen, MD;  Location: [facility];  Service: General;  Laterality: N/A;  . TONSILLECTOMY    . WISDOM TOOTH EXTRACTION      I have reviewed the social history and family history with the patient and they are unchanged from previous note.  ALLERGIES:  has No Known Allergies.  MEDICATIONS:  Current Outpatient Medications  Medication Sig Dispense Refill  . albuterol (PROVENTIL HFA;VENTOLIN HFA) 108 (90 Base) MCG/ACT inhaler Inhale 2 puffs into the lungs every 6 (six) hours as needed for wheezing or shortness of breath.    Marland Kitchen aspirin 81 MG tablet Take 81 mg by mouth daily.    Marland Kitchen buPROPion (WELLBUTRIN XL) 150 MG 24 hr tablet Take 300 mg by mouth daily.     . busPIRone (BUSPAR) 15 MG tablet Take 15 mg by mouth 2 (two) times daily.     . calcium carbonate (OS-CAL) 600 MG TABS tablet Take 600 mg by mouth daily with breakfast.     . Calcium Polycarbophil (FIBER-CAPS PO) Take 1 capsule by mouth daily.     . cetirizine (ZYRTEC) 10 MG tablet Take 10 mg by mouth at bedtime.     . Chlorpheniramine Maleate (CHLOR-TRIMETON PO) Take 1 tablet by mouth daily.    . CRESTOR 10 MG tablet Take 10 mg by mouth at bedtime.     . fluticasone (FLONASE) 50 MCG/ACT nasal spray Place into both nostrils as directed.    Marland Kitchen lisinopril (PRINIVIL,ZESTRIL) 5 MG tablet Take 5 mg by mouth daily.     . Multiple Vitamin (MULTIVITAMIN WITH MINERALS) TABS tablet Take 1 tablet by mouth daily.    Marland Kitchen MYRBETRIQ 50 MG TB24  tablet Take 50 mg by mouth daily.  12  . pantoprazole (PROTONIX) 40 MG tablet Take 40 mg by mouth as directed.    . Probiotic Product (PROBIOTIC PO) Take 1 tablet by mouth daily.    . raloxifene (EVISTA) 60 MG tablet Take 1 tablet (60 mg total) by mouth daily. 90 tablet 2   No current facility-administered medications for this visit.     PHYSICAL EXAMINATION: ECOG PERFORMANCE STATUS: 0 - Asymptomatic  Vitals:   02/05/19 1557  BP: 125/89  Pulse: 89  Resp: 18  Temp: 98.5 F (36.9 C)  SpO2: 95%   Filed Weights   02/05/19 1557  Weight: 183 lb 4.8 oz (83.1 kg)    GENERAL:alert, no distress and comfortable SKIN: skin color, texture, turgor are normal, no rashes or significant lesions EYES: normal, Conjunctiva are pink and non-injected, sclera clear  NECK: supple, thyroid normal size, non-tender, without nodularity LYMPH:  no palpable lymphadenopathy in the cervical, axillary  LUNGS: clear to auscultation and percussion with normal breathing effort HEART: regular rate & rhythm and no murmurs and no lower extremity edema ABDOMEN:abdomen soft, non-tender and normal bowel sounds  Musculoskeletal:no cyanosis of digits and no clubbing  NEURO: alert & oriented x 3 with fluent speech, no focal motor/sensory deficits BREAST: S/p right lumpectomy: surgical incision healed well. No palpable mass, nodules or adenopathy bilaterally. Breast exam benign.   LABORATORY DATA:  I have reviewed the data as listed CBC Latest Ref Rng & Units 02/05/2019 06/05/2018 05/14/2017  WBC 4.0 - 10.5 K/uL 6.3 6.2 6.8  Hemoglobin 12.0 - 15.0 g/dL 12.4 12.1 12.5  Hematocrit 36.0 - 46.0 % 37.4 36.7 37.6  Platelets 150 - 400 K/uL 204 187 216     CMP Latest Ref Rng & Units 02/05/2019 06/05/2018 05/14/2017  Glucose 70 - 99 mg/dL 116(H) 90 110  BUN 8 - 23 mg/dL 10 8 9.3  Creatinine 0.44 - 1.00 mg/dL 0.87 0.78 0.9  Sodium 135 - 145 mmol/L 144 142 141  Potassium 3.5 - 5.1 mmol/L 4.0 4.0 3.8  Chloride 98 - 111 mmol/L  107 107 -  CO2 22 - 32 mmol/L 26 27 24   Calcium 8.9 - 10.3 mg/dL 9.4 9.3 9.6  Total Protein 6.5 - 8.1 g/dL 7.0 6.8 7.2  Total Bilirubin 0.3 - 1.2 mg/dL 0.4 0.5 0.55  Alkaline Phos 38 - 126 U/L 101 98 93  AST 15 - 41 U/L 24 26 40(H)  ALT 0 - 44 U/L 16 22 30       RADIOGRAPHIC STUDIES: I have personally reviewed the radiological images as listed and agreed with the findings in the report. No results found.   ASSESSMENT & PLAN:  Rachel Edwards is a 68 y.o. female with   1. DCIS of right breast, Intermediate grade  ER/PR positive -Diagnosed in 2015. Treated with right lumpectomy and antihormonal therapy. Tamoxifen was started on 12/05/2013, but stopped on 10/11/2016 due to risk of interaction with Wellbutrin. Raloxifene was started in 09/2016. She completed 5 years of antiestrogen therapy in 01/2019.  -She is clinically doing well. Lab reviewed, her CBC and CMP are within normal limits. Her physical exam and her 12/2018 mammogram were unremarkable. There is no clinical concern for recurrence. -Continue surveillance. Next screening mammogram in 12/2019. I discussed she does have the option of breast MRI which is more sensitive. This can be done every 1-2 years for closer surveillance. She will think about it. Doe to high copay she opted not to have it for now.  -She will f/u with PCP and Gyn moving forward and f/u with me as needed in the future.   2. HTN -f/u with PCP and continue medications  3. Hepatic steatosis -I advised her to monitor her cholesterol. She is currently on medications. -I advised her to monitor her fatty liver with [place] and f/u with GI to monitor for progression to cirrhosis -I also recommend healthy diet, exercise, and try to loose some weight    Plan  -continue breast cancer surveillance  -Stop Raloxifene in 01/2019  -Screening Mammogram in 12/2019 -f/u as needed the future   No problem-specific Assessment & Plan notes found for this encounter.   Orders Placed  This Encounter  Procedures  . MM Digital Screening    Standing Status:   Future    Standing Expiration Date:   02/05/2020    Order Specific Question:   Reason for Exam (SYMPTOM  OR DIAGNOSIS REQUIRED)    Answer:   screening    Order Specific Question:   Preferred imaging location?    Answer:   [facility]   All questions were answered. The patient knows to  call the clinic with any problems, questions or concerns. No barriers to learning was detected. I spent 15 minutes counseling the patient face to face. The total time spent in the appointment was 20 minutes and more than 50% was on counseling and review of test results     Truitt Merle, MD 02/05/2019   I, Joslyn Devon, am acting as scribe for Truitt Merle, MD.   I have reviewed the above documentation for accuracy and completeness, and I agree with the above.

## 2019-02-05 ENCOUNTER — Inpatient Hospital Stay (HOSPITAL_BASED_OUTPATIENT_CLINIC_OR_DEPARTMENT_OTHER): Payer: Medicare Other | Admitting: Hematology

## 2019-02-05 ENCOUNTER — Other Ambulatory Visit: Payer: Self-pay

## 2019-02-05 ENCOUNTER — Inpatient Hospital Stay: Payer: Medicare Other | Attending: Hematology

## 2019-02-05 ENCOUNTER — Encounter: Payer: Self-pay | Admitting: Hematology

## 2019-02-05 VITALS — BP 125/89 | HR 89 | Temp 98.5°F | Resp 18 | Ht 65.0 in | Wt 183.3 lb

## 2019-02-05 DIAGNOSIS — Z1231 Encounter for screening mammogram for malignant neoplasm of breast: Secondary | ICD-10-CM

## 2019-02-05 DIAGNOSIS — I10 Essential (primary) hypertension: Secondary | ICD-10-CM | POA: Diagnosis not present

## 2019-02-05 DIAGNOSIS — Z79899 Other long term (current) drug therapy: Secondary | ICD-10-CM | POA: Diagnosis not present

## 2019-02-05 DIAGNOSIS — D0511 Intraductal carcinoma in situ of right breast: Secondary | ICD-10-CM

## 2019-02-05 DIAGNOSIS — Z170 Estrogen receptor positive status [ER+]: Secondary | ICD-10-CM | POA: Diagnosis not present

## 2019-02-05 LAB — CBC WITH DIFFERENTIAL/PLATELET
Abs Immature Granulocytes: 0.01 10*3/uL (ref 0.00–0.07)
Basophils Absolute: 0 10*3/uL (ref 0.0–0.1)
Basophils Relative: 1 %
Eosinophils Absolute: 0.1 10*3/uL (ref 0.0–0.5)
Eosinophils Relative: 1 %
HCT: 37.4 % (ref 36.0–46.0)
Hemoglobin: 12.4 g/dL (ref 12.0–15.0)
Immature Granulocytes: 0 %
Lymphocytes Relative: 33 %
Lymphs Abs: 2.1 10*3/uL (ref 0.7–4.0)
MCH: 30.9 pg (ref 26.0–34.0)
MCHC: 33.2 g/dL (ref 30.0–36.0)
MCV: 93.3 fL (ref 80.0–100.0)
Monocytes Absolute: 0.5 10*3/uL (ref 0.1–1.0)
Monocytes Relative: 8 %
Neutro Abs: 3.6 10*3/uL (ref 1.7–7.7)
Neutrophils Relative %: 57 %
Platelets: 204 10*3/uL (ref 150–400)
RBC: 4.01 MIL/uL (ref 3.87–5.11)
RDW: 12.7 % (ref 11.5–15.5)
WBC: 6.3 10*3/uL (ref 4.0–10.5)
nRBC: 0 % (ref 0.0–0.2)

## 2019-02-05 LAB — COMPREHENSIVE METABOLIC PANEL
ALT: 16 U/L (ref 0–44)
AST: 24 U/L (ref 15–41)
Albumin: 4.3 g/dL (ref 3.5–5.0)
Alkaline Phosphatase: 101 U/L (ref 38–126)
Anion gap: 11 (ref 5–15)
BUN: 10 mg/dL (ref 8–23)
CO2: 26 mmol/L (ref 22–32)
Calcium: 9.4 mg/dL (ref 8.9–10.3)
Chloride: 107 mmol/L (ref 98–111)
Creatinine, Ser: 0.87 mg/dL (ref 0.44–1.00)
GFR calc Af Amer: >60 mL/min (ref >60)
GFR calc non Af Amer: >60 mL/min (ref >60)
Glucose, Bld: 116 mg/dL — ABNORMAL HIGH (ref 70–99)
Potassium: 4 mmol/L (ref 3.5–5.1)
Sodium: 144 mmol/L (ref 135–145)
Total Bilirubin: 0.4 mg/dL (ref 0.3–1.2)
Total Protein: 7 g/dL (ref 6.5–8.1)

## 2019-02-06 ENCOUNTER — Telehealth: Payer: Self-pay | Admitting: Hematology

## 2019-02-06 NOTE — Telephone Encounter (Signed)
Per 9/10 los F/u open

## 2019-04-05 ENCOUNTER — Other Ambulatory Visit: Payer: Self-pay | Admitting: Hematology

## 2019-04-05 DIAGNOSIS — D0511 Intraductal carcinoma in situ of right breast: Secondary | ICD-10-CM

## 2020-01-11 ENCOUNTER — Ambulatory Visit: Payer: Medicare Other

## 2020-01-18 ENCOUNTER — Other Ambulatory Visit: Payer: Self-pay

## 2020-01-18 ENCOUNTER — Ambulatory Visit
Admission: RE | Admit: 2020-01-18 | Discharge: 2020-01-18 | Disposition: A | Discharge status: Home / Self Care | Payer: Medicare PPO | Source: Ambulatory Visit | Attending: Hematology | Admitting: Hematology

## 2020-01-18 DIAGNOSIS — Z1231 Encounter for screening mammogram for malignant neoplasm of breast: Secondary | ICD-10-CM

## 2020-08-08 DIAGNOSIS — M9904 Segmental and somatic dysfunction of sacral region: Secondary | ICD-10-CM | POA: Diagnosis not present

## 2020-08-08 DIAGNOSIS — M47817 Spondylosis without myelopathy or radiculopathy, lumbosacral region: Secondary | ICD-10-CM | POA: Diagnosis not present

## 2020-08-08 DIAGNOSIS — M9903 Segmental and somatic dysfunction of lumbar region: Secondary | ICD-10-CM | POA: Diagnosis not present

## 2020-08-08 DIAGNOSIS — S29012A Strain of muscle and tendon of back wall of thorax, initial encounter: Secondary | ICD-10-CM | POA: Diagnosis not present

## 2020-08-08 DIAGNOSIS — M47816 Spondylosis without myelopathy or radiculopathy, lumbar region: Secondary | ICD-10-CM | POA: Diagnosis not present

## 2020-08-08 DIAGNOSIS — M9902 Segmental and somatic dysfunction of thoracic region: Secondary | ICD-10-CM | POA: Diagnosis not present

## 2020-08-15 DIAGNOSIS — M47816 Spondylosis without myelopathy or radiculopathy, lumbar region: Secondary | ICD-10-CM | POA: Diagnosis not present

## 2020-08-15 DIAGNOSIS — M9902 Segmental and somatic dysfunction of thoracic region: Secondary | ICD-10-CM | POA: Diagnosis not present

## 2020-08-15 DIAGNOSIS — M9903 Segmental and somatic dysfunction of lumbar region: Secondary | ICD-10-CM | POA: Diagnosis not present

## 2020-08-15 DIAGNOSIS — S29012A Strain of muscle and tendon of back wall of thorax, initial encounter: Secondary | ICD-10-CM | POA: Diagnosis not present

## 2020-08-15 DIAGNOSIS — M47817 Spondylosis without myelopathy or radiculopathy, lumbosacral region: Secondary | ICD-10-CM | POA: Diagnosis not present

## 2020-08-15 DIAGNOSIS — M9904 Segmental and somatic dysfunction of sacral region: Secondary | ICD-10-CM | POA: Diagnosis not present

## 2020-08-22 DIAGNOSIS — S29012A Strain of muscle and tendon of back wall of thorax, initial encounter: Secondary | ICD-10-CM | POA: Diagnosis not present

## 2020-08-22 DIAGNOSIS — M9903 Segmental and somatic dysfunction of lumbar region: Secondary | ICD-10-CM | POA: Diagnosis not present

## 2020-08-22 DIAGNOSIS — M47816 Spondylosis without myelopathy or radiculopathy, lumbar region: Secondary | ICD-10-CM | POA: Diagnosis not present

## 2020-08-22 DIAGNOSIS — M9902 Segmental and somatic dysfunction of thoracic region: Secondary | ICD-10-CM | POA: Diagnosis not present

## 2020-08-22 DIAGNOSIS — M9904 Segmental and somatic dysfunction of sacral region: Secondary | ICD-10-CM | POA: Diagnosis not present

## 2020-08-22 DIAGNOSIS — M47817 Spondylosis without myelopathy or radiculopathy, lumbosacral region: Secondary | ICD-10-CM | POA: Diagnosis not present

## 2020-08-29 DIAGNOSIS — M9904 Segmental and somatic dysfunction of sacral region: Secondary | ICD-10-CM | POA: Diagnosis not present

## 2020-08-29 DIAGNOSIS — M47816 Spondylosis without myelopathy or radiculopathy, lumbar region: Secondary | ICD-10-CM | POA: Diagnosis not present

## 2020-08-29 DIAGNOSIS — M9903 Segmental and somatic dysfunction of lumbar region: Secondary | ICD-10-CM | POA: Diagnosis not present

## 2020-08-29 DIAGNOSIS — M9902 Segmental and somatic dysfunction of thoracic region: Secondary | ICD-10-CM | POA: Diagnosis not present

## 2020-08-29 DIAGNOSIS — M47817 Spondylosis without myelopathy or radiculopathy, lumbosacral region: Secondary | ICD-10-CM | POA: Diagnosis not present

## 2020-08-29 DIAGNOSIS — S29012A Strain of muscle and tendon of back wall of thorax, initial encounter: Secondary | ICD-10-CM | POA: Diagnosis not present

## 2020-09-19 DIAGNOSIS — S29012A Strain of muscle and tendon of back wall of thorax, initial encounter: Secondary | ICD-10-CM | POA: Diagnosis not present

## 2020-09-19 DIAGNOSIS — M9902 Segmental and somatic dysfunction of thoracic region: Secondary | ICD-10-CM | POA: Diagnosis not present

## 2020-09-19 DIAGNOSIS — M47816 Spondylosis without myelopathy or radiculopathy, lumbar region: Secondary | ICD-10-CM | POA: Diagnosis not present

## 2020-09-19 DIAGNOSIS — M9904 Segmental and somatic dysfunction of sacral region: Secondary | ICD-10-CM | POA: Diagnosis not present

## 2020-09-19 DIAGNOSIS — M47817 Spondylosis without myelopathy or radiculopathy, lumbosacral region: Secondary | ICD-10-CM | POA: Diagnosis not present

## 2020-09-19 DIAGNOSIS — M9903 Segmental and somatic dysfunction of lumbar region: Secondary | ICD-10-CM | POA: Diagnosis not present

## 2020-10-10 DIAGNOSIS — M9904 Segmental and somatic dysfunction of sacral region: Secondary | ICD-10-CM | POA: Diagnosis not present

## 2020-10-10 DIAGNOSIS — M9902 Segmental and somatic dysfunction of thoracic region: Secondary | ICD-10-CM | POA: Diagnosis not present

## 2020-10-10 DIAGNOSIS — M47817 Spondylosis without myelopathy or radiculopathy, lumbosacral region: Secondary | ICD-10-CM | POA: Diagnosis not present

## 2020-10-10 DIAGNOSIS — M9903 Segmental and somatic dysfunction of lumbar region: Secondary | ICD-10-CM | POA: Diagnosis not present

## 2020-10-10 DIAGNOSIS — S29012A Strain of muscle and tendon of back wall of thorax, initial encounter: Secondary | ICD-10-CM | POA: Diagnosis not present

## 2020-10-10 DIAGNOSIS — M47816 Spondylosis without myelopathy or radiculopathy, lumbar region: Secondary | ICD-10-CM | POA: Diagnosis not present

## 2020-10-12 DIAGNOSIS — L03012 Cellulitis of left finger: Secondary | ICD-10-CM | POA: Diagnosis not present

## 2020-11-07 DIAGNOSIS — S29012A Strain of muscle and tendon of back wall of thorax, initial encounter: Secondary | ICD-10-CM | POA: Diagnosis not present

## 2020-11-07 DIAGNOSIS — M9904 Segmental and somatic dysfunction of sacral region: Secondary | ICD-10-CM | POA: Diagnosis not present

## 2020-11-07 DIAGNOSIS — M47816 Spondylosis without myelopathy or radiculopathy, lumbar region: Secondary | ICD-10-CM | POA: Diagnosis not present

## 2020-11-07 DIAGNOSIS — M9903 Segmental and somatic dysfunction of lumbar region: Secondary | ICD-10-CM | POA: Diagnosis not present

## 2020-11-07 DIAGNOSIS — M9902 Segmental and somatic dysfunction of thoracic region: Secondary | ICD-10-CM | POA: Diagnosis not present

## 2020-11-07 DIAGNOSIS — M47817 Spondylosis without myelopathy or radiculopathy, lumbosacral region: Secondary | ICD-10-CM | POA: Diagnosis not present

## 2020-12-05 ENCOUNTER — Other Ambulatory Visit: Payer: Self-pay | Admitting: Obstetrics & Gynecology

## 2020-12-05 ENCOUNTER — Other Ambulatory Visit: Payer: Self-pay | Admitting: Hematology

## 2020-12-05 DIAGNOSIS — Z1231 Encounter for screening mammogram for malignant neoplasm of breast: Secondary | ICD-10-CM

## 2020-12-14 DIAGNOSIS — M47817 Spondylosis without myelopathy or radiculopathy, lumbosacral region: Secondary | ICD-10-CM | POA: Diagnosis not present

## 2020-12-14 DIAGNOSIS — M9903 Segmental and somatic dysfunction of lumbar region: Secondary | ICD-10-CM | POA: Diagnosis not present

## 2020-12-14 DIAGNOSIS — M9904 Segmental and somatic dysfunction of sacral region: Secondary | ICD-10-CM | POA: Diagnosis not present

## 2020-12-14 DIAGNOSIS — M9902 Segmental and somatic dysfunction of thoracic region: Secondary | ICD-10-CM | POA: Diagnosis not present

## 2020-12-14 DIAGNOSIS — M47816 Spondylosis without myelopathy or radiculopathy, lumbar region: Secondary | ICD-10-CM | POA: Diagnosis not present

## 2020-12-14 DIAGNOSIS — S29012A Strain of muscle and tendon of back wall of thorax, initial encounter: Secondary | ICD-10-CM | POA: Diagnosis not present

## 2021-01-11 DIAGNOSIS — M9903 Segmental and somatic dysfunction of lumbar region: Secondary | ICD-10-CM | POA: Diagnosis not present

## 2021-01-11 DIAGNOSIS — M9902 Segmental and somatic dysfunction of thoracic region: Secondary | ICD-10-CM | POA: Diagnosis not present

## 2021-01-11 DIAGNOSIS — M47816 Spondylosis without myelopathy or radiculopathy, lumbar region: Secondary | ICD-10-CM | POA: Diagnosis not present

## 2021-01-11 DIAGNOSIS — M47817 Spondylosis without myelopathy or radiculopathy, lumbosacral region: Secondary | ICD-10-CM | POA: Diagnosis not present

## 2021-01-11 DIAGNOSIS — M9904 Segmental and somatic dysfunction of sacral region: Secondary | ICD-10-CM | POA: Diagnosis not present

## 2021-01-11 DIAGNOSIS — S29012A Strain of muscle and tendon of back wall of thorax, initial encounter: Secondary | ICD-10-CM | POA: Diagnosis not present

## 2021-01-25 DIAGNOSIS — L723 Sebaceous cyst: Secondary | ICD-10-CM | POA: Diagnosis not present

## 2021-01-25 DIAGNOSIS — L720 Epidermal cyst: Secondary | ICD-10-CM | POA: Diagnosis not present

## 2021-01-25 DIAGNOSIS — D229 Melanocytic nevi, unspecified: Secondary | ICD-10-CM | POA: Diagnosis not present

## 2021-01-25 DIAGNOSIS — D225 Melanocytic nevi of trunk: Secondary | ICD-10-CM | POA: Diagnosis not present

## 2021-01-25 DIAGNOSIS — L820 Inflamed seborrheic keratosis: Secondary | ICD-10-CM | POA: Diagnosis not present

## 2021-01-25 DIAGNOSIS — L821 Other seborrheic keratosis: Secondary | ICD-10-CM | POA: Diagnosis not present

## 2021-01-26 ENCOUNTER — Ambulatory Visit
Admission: RE | Admit: 2021-01-26 | Discharge: 2021-01-26 | Disposition: A | Discharge status: Home / Self Care | Payer: Medicare PPO | Source: Ambulatory Visit | Attending: Obstetrics & Gynecology | Admitting: Obstetrics & Gynecology

## 2021-01-26 ENCOUNTER — Other Ambulatory Visit: Payer: Self-pay

## 2021-01-26 DIAGNOSIS — Z1231 Encounter for screening mammogram for malignant neoplasm of breast: Secondary | ICD-10-CM

## 2021-02-02 DIAGNOSIS — M8588 Other specified disorders of bone density and structure, other site: Secondary | ICD-10-CM | POA: Diagnosis not present

## 2021-02-02 DIAGNOSIS — Z90710 Acquired absence of both cervix and uterus: Secondary | ICD-10-CM | POA: Diagnosis not present

## 2021-02-02 DIAGNOSIS — N958 Other specified menopausal and perimenopausal disorders: Secondary | ICD-10-CM | POA: Diagnosis not present

## 2021-02-02 DIAGNOSIS — Z124 Encounter for screening for malignant neoplasm of cervix: Secondary | ICD-10-CM | POA: Diagnosis not present

## 2021-02-02 DIAGNOSIS — Z1272 Encounter for screening for malignant neoplasm of vagina: Secondary | ICD-10-CM | POA: Diagnosis not present

## 2021-02-02 DIAGNOSIS — Z6830 Body mass index (BMI) 30.0-30.9, adult: Secondary | ICD-10-CM | POA: Diagnosis not present

## 2021-02-15 DIAGNOSIS — S29012A Strain of muscle and tendon of back wall of thorax, initial encounter: Secondary | ICD-10-CM | POA: Diagnosis not present

## 2021-02-15 DIAGNOSIS — M9902 Segmental and somatic dysfunction of thoracic region: Secondary | ICD-10-CM | POA: Diagnosis not present

## 2021-02-15 DIAGNOSIS — M47816 Spondylosis without myelopathy or radiculopathy, lumbar region: Secondary | ICD-10-CM | POA: Diagnosis not present

## 2021-02-15 DIAGNOSIS — M9903 Segmental and somatic dysfunction of lumbar region: Secondary | ICD-10-CM | POA: Diagnosis not present

## 2021-02-15 DIAGNOSIS — M9904 Segmental and somatic dysfunction of sacral region: Secondary | ICD-10-CM | POA: Diagnosis not present

## 2021-02-15 DIAGNOSIS — M47817 Spondylosis without myelopathy or radiculopathy, lumbosacral region: Secondary | ICD-10-CM | POA: Diagnosis not present

## 2021-03-07 DIAGNOSIS — F419 Anxiety disorder, unspecified: Secondary | ICD-10-CM | POA: Diagnosis not present

## 2021-03-07 DIAGNOSIS — E782 Mixed hyperlipidemia: Secondary | ICD-10-CM | POA: Diagnosis not present

## 2021-03-07 DIAGNOSIS — I10 Essential (primary) hypertension: Secondary | ICD-10-CM | POA: Diagnosis not present

## 2021-03-07 DIAGNOSIS — J45909 Unspecified asthma, uncomplicated: Secondary | ICD-10-CM | POA: Diagnosis not present

## 2021-03-07 DIAGNOSIS — Z853 Personal history of malignant neoplasm of breast: Secondary | ICD-10-CM | POA: Diagnosis not present

## 2021-03-07 DIAGNOSIS — J309 Allergic rhinitis, unspecified: Secondary | ICD-10-CM | POA: Diagnosis not present

## 2021-03-07 DIAGNOSIS — K219 Gastro-esophageal reflux disease without esophagitis: Secondary | ICD-10-CM | POA: Diagnosis not present

## 2021-03-07 DIAGNOSIS — R7303 Prediabetes: Secondary | ICD-10-CM | POA: Diagnosis not present

## 2021-03-07 DIAGNOSIS — N3281 Overactive bladder: Secondary | ICD-10-CM | POA: Diagnosis not present

## 2021-03-09 DIAGNOSIS — K648 Other hemorrhoids: Secondary | ICD-10-CM | POA: Diagnosis not present

## 2021-03-09 DIAGNOSIS — D120 Benign neoplasm of cecum: Secondary | ICD-10-CM | POA: Diagnosis not present

## 2021-03-09 DIAGNOSIS — Z86010 Personal history of colonic polyps: Secondary | ICD-10-CM | POA: Diagnosis not present

## 2021-03-09 DIAGNOSIS — K6389 Other specified diseases of intestine: Secondary | ICD-10-CM | POA: Diagnosis not present

## 2021-03-09 DIAGNOSIS — K529 Noninfective gastroenteritis and colitis, unspecified: Secondary | ICD-10-CM | POA: Diagnosis not present

## 2021-03-09 DIAGNOSIS — D125 Benign neoplasm of sigmoid colon: Secondary | ICD-10-CM | POA: Diagnosis not present

## 2021-03-09 DIAGNOSIS — D122 Benign neoplasm of ascending colon: Secondary | ICD-10-CM | POA: Diagnosis not present

## 2021-03-14 DIAGNOSIS — D125 Benign neoplasm of sigmoid colon: Secondary | ICD-10-CM | POA: Diagnosis not present

## 2021-03-14 DIAGNOSIS — D120 Benign neoplasm of cecum: Secondary | ICD-10-CM | POA: Diagnosis not present

## 2021-03-14 DIAGNOSIS — D122 Benign neoplasm of ascending colon: Secondary | ICD-10-CM | POA: Diagnosis not present

## 2021-03-14 DIAGNOSIS — K529 Noninfective gastroenteritis and colitis, unspecified: Secondary | ICD-10-CM | POA: Diagnosis not present

## 2021-03-21 DIAGNOSIS — M9904 Segmental and somatic dysfunction of sacral region: Secondary | ICD-10-CM | POA: Diagnosis not present

## 2021-03-21 DIAGNOSIS — M47816 Spondylosis without myelopathy or radiculopathy, lumbar region: Secondary | ICD-10-CM | POA: Diagnosis not present

## 2021-03-21 DIAGNOSIS — M9902 Segmental and somatic dysfunction of thoracic region: Secondary | ICD-10-CM | POA: Diagnosis not present

## 2021-03-21 DIAGNOSIS — S29012A Strain of muscle and tendon of back wall of thorax, initial encounter: Secondary | ICD-10-CM | POA: Diagnosis not present

## 2021-03-21 DIAGNOSIS — M9903 Segmental and somatic dysfunction of lumbar region: Secondary | ICD-10-CM | POA: Diagnosis not present

## 2021-03-21 DIAGNOSIS — M47817 Spondylosis without myelopathy or radiculopathy, lumbosacral region: Secondary | ICD-10-CM | POA: Diagnosis not present

## 2021-05-08 DIAGNOSIS — M9904 Segmental and somatic dysfunction of sacral region: Secondary | ICD-10-CM | POA: Diagnosis not present

## 2021-05-08 DIAGNOSIS — M47816 Spondylosis without myelopathy or radiculopathy, lumbar region: Secondary | ICD-10-CM | POA: Diagnosis not present

## 2021-05-08 DIAGNOSIS — S29012A Strain of muscle and tendon of back wall of thorax, initial encounter: Secondary | ICD-10-CM | POA: Diagnosis not present

## 2021-05-08 DIAGNOSIS — M9902 Segmental and somatic dysfunction of thoracic region: Secondary | ICD-10-CM | POA: Diagnosis not present

## 2021-05-08 DIAGNOSIS — M9903 Segmental and somatic dysfunction of lumbar region: Secondary | ICD-10-CM | POA: Diagnosis not present

## 2021-05-08 DIAGNOSIS — M47817 Spondylosis without myelopathy or radiculopathy, lumbosacral region: Secondary | ICD-10-CM | POA: Diagnosis not present

## 2021-05-29 DIAGNOSIS — J0140 Acute pansinusitis, unspecified: Secondary | ICD-10-CM | POA: Diagnosis not present

## 2021-06-12 DIAGNOSIS — M47817 Spondylosis without myelopathy or radiculopathy, lumbosacral region: Secondary | ICD-10-CM | POA: Diagnosis not present

## 2021-06-12 DIAGNOSIS — M9902 Segmental and somatic dysfunction of thoracic region: Secondary | ICD-10-CM | POA: Diagnosis not present

## 2021-06-12 DIAGNOSIS — M9904 Segmental and somatic dysfunction of sacral region: Secondary | ICD-10-CM | POA: Diagnosis not present

## 2021-06-12 DIAGNOSIS — M47816 Spondylosis without myelopathy or radiculopathy, lumbar region: Secondary | ICD-10-CM | POA: Diagnosis not present

## 2021-06-12 DIAGNOSIS — S29012A Strain of muscle and tendon of back wall of thorax, initial encounter: Secondary | ICD-10-CM | POA: Diagnosis not present

## 2021-06-12 DIAGNOSIS — M9903 Segmental and somatic dysfunction of lumbar region: Secondary | ICD-10-CM | POA: Diagnosis not present

## 2021-07-10 DIAGNOSIS — L989 Disorder of the skin and subcutaneous tissue, unspecified: Secondary | ICD-10-CM | POA: Diagnosis not present

## 2021-07-10 DIAGNOSIS — M9902 Segmental and somatic dysfunction of thoracic region: Secondary | ICD-10-CM | POA: Diagnosis not present

## 2021-07-10 DIAGNOSIS — M9904 Segmental and somatic dysfunction of sacral region: Secondary | ICD-10-CM | POA: Diagnosis not present

## 2021-07-10 DIAGNOSIS — M47816 Spondylosis without myelopathy or radiculopathy, lumbar region: Secondary | ICD-10-CM | POA: Diagnosis not present

## 2021-07-10 DIAGNOSIS — S29012A Strain of muscle and tendon of back wall of thorax, initial encounter: Secondary | ICD-10-CM | POA: Diagnosis not present

## 2021-07-10 DIAGNOSIS — M47817 Spondylosis without myelopathy or radiculopathy, lumbosacral region: Secondary | ICD-10-CM | POA: Diagnosis not present

## 2021-07-10 DIAGNOSIS — M9903 Segmental and somatic dysfunction of lumbar region: Secondary | ICD-10-CM | POA: Diagnosis not present

## 2021-07-10 DIAGNOSIS — L7211 Pilar cyst: Secondary | ICD-10-CM | POA: Diagnosis not present

## 2021-09-11 DIAGNOSIS — M47816 Spondylosis without myelopathy or radiculopathy, lumbar region: Secondary | ICD-10-CM | POA: Diagnosis not present

## 2021-09-11 DIAGNOSIS — S29012A Strain of muscle and tendon of back wall of thorax, initial encounter: Secondary | ICD-10-CM | POA: Diagnosis not present

## 2021-09-11 DIAGNOSIS — M9903 Segmental and somatic dysfunction of lumbar region: Secondary | ICD-10-CM | POA: Diagnosis not present

## 2021-09-11 DIAGNOSIS — M9904 Segmental and somatic dysfunction of sacral region: Secondary | ICD-10-CM | POA: Diagnosis not present

## 2021-09-11 DIAGNOSIS — M9902 Segmental and somatic dysfunction of thoracic region: Secondary | ICD-10-CM | POA: Diagnosis not present

## 2021-09-11 DIAGNOSIS — M47817 Spondylosis without myelopathy or radiculopathy, lumbosacral region: Secondary | ICD-10-CM | POA: Diagnosis not present

## 2021-09-14 DIAGNOSIS — E782 Mixed hyperlipidemia: Secondary | ICD-10-CM | POA: Diagnosis not present

## 2021-09-14 DIAGNOSIS — K219 Gastro-esophageal reflux disease without esophagitis: Secondary | ICD-10-CM | POA: Diagnosis not present

## 2021-09-14 DIAGNOSIS — Z853 Personal history of malignant neoplasm of breast: Secondary | ICD-10-CM | POA: Diagnosis not present

## 2021-09-14 DIAGNOSIS — R7303 Prediabetes: Secondary | ICD-10-CM | POA: Diagnosis not present

## 2021-09-14 DIAGNOSIS — N3281 Overactive bladder: Secondary | ICD-10-CM | POA: Diagnosis not present

## 2021-09-14 DIAGNOSIS — J309 Allergic rhinitis, unspecified: Secondary | ICD-10-CM | POA: Diagnosis not present

## 2021-09-14 DIAGNOSIS — J45909 Unspecified asthma, uncomplicated: Secondary | ICD-10-CM | POA: Diagnosis not present

## 2021-09-14 DIAGNOSIS — I10 Essential (primary) hypertension: Secondary | ICD-10-CM | POA: Diagnosis not present

## 2021-09-14 DIAGNOSIS — F419 Anxiety disorder, unspecified: Secondary | ICD-10-CM | POA: Diagnosis not present

## 2021-10-09 DIAGNOSIS — M47816 Spondylosis without myelopathy or radiculopathy, lumbar region: Secondary | ICD-10-CM | POA: Diagnosis not present

## 2021-10-09 DIAGNOSIS — S29012A Strain of muscle and tendon of back wall of thorax, initial encounter: Secondary | ICD-10-CM | POA: Diagnosis not present

## 2021-10-09 DIAGNOSIS — M9902 Segmental and somatic dysfunction of thoracic region: Secondary | ICD-10-CM | POA: Diagnosis not present

## 2021-10-09 DIAGNOSIS — M9903 Segmental and somatic dysfunction of lumbar region: Secondary | ICD-10-CM | POA: Diagnosis not present

## 2021-10-09 DIAGNOSIS — M9904 Segmental and somatic dysfunction of sacral region: Secondary | ICD-10-CM | POA: Diagnosis not present

## 2021-10-09 DIAGNOSIS — M47817 Spondylosis without myelopathy or radiculopathy, lumbosacral region: Secondary | ICD-10-CM | POA: Diagnosis not present

## 2021-11-14 DIAGNOSIS — M9904 Segmental and somatic dysfunction of sacral region: Secondary | ICD-10-CM | POA: Diagnosis not present

## 2021-11-14 DIAGNOSIS — M47816 Spondylosis without myelopathy or radiculopathy, lumbar region: Secondary | ICD-10-CM | POA: Diagnosis not present

## 2021-11-14 DIAGNOSIS — M9902 Segmental and somatic dysfunction of thoracic region: Secondary | ICD-10-CM | POA: Diagnosis not present

## 2021-11-14 DIAGNOSIS — S29012A Strain of muscle and tendon of back wall of thorax, initial encounter: Secondary | ICD-10-CM | POA: Diagnosis not present

## 2021-11-14 DIAGNOSIS — M47817 Spondylosis without myelopathy or radiculopathy, lumbosacral region: Secondary | ICD-10-CM | POA: Diagnosis not present

## 2021-11-14 DIAGNOSIS — M9903 Segmental and somatic dysfunction of lumbar region: Secondary | ICD-10-CM | POA: Diagnosis not present

## 2021-11-24 DIAGNOSIS — M47817 Spondylosis without myelopathy or radiculopathy, lumbosacral region: Secondary | ICD-10-CM | POA: Diagnosis not present

## 2021-11-24 DIAGNOSIS — M9904 Segmental and somatic dysfunction of sacral region: Secondary | ICD-10-CM | POA: Diagnosis not present

## 2021-11-24 DIAGNOSIS — M47816 Spondylosis without myelopathy or radiculopathy, lumbar region: Secondary | ICD-10-CM | POA: Diagnosis not present

## 2021-11-24 DIAGNOSIS — M9903 Segmental and somatic dysfunction of lumbar region: Secondary | ICD-10-CM | POA: Diagnosis not present

## 2021-11-24 DIAGNOSIS — M9902 Segmental and somatic dysfunction of thoracic region: Secondary | ICD-10-CM | POA: Diagnosis not present

## 2021-11-24 DIAGNOSIS — S29012A Strain of muscle and tendon of back wall of thorax, initial encounter: Secondary | ICD-10-CM | POA: Diagnosis not present

## 2021-12-19 DIAGNOSIS — S29012A Strain of muscle and tendon of back wall of thorax, initial encounter: Secondary | ICD-10-CM | POA: Diagnosis not present

## 2021-12-19 DIAGNOSIS — M47817 Spondylosis without myelopathy or radiculopathy, lumbosacral region: Secondary | ICD-10-CM | POA: Diagnosis not present

## 2021-12-19 DIAGNOSIS — M47816 Spondylosis without myelopathy or radiculopathy, lumbar region: Secondary | ICD-10-CM | POA: Diagnosis not present

## 2021-12-19 DIAGNOSIS — M9903 Segmental and somatic dysfunction of lumbar region: Secondary | ICD-10-CM | POA: Diagnosis not present

## 2021-12-19 DIAGNOSIS — M9904 Segmental and somatic dysfunction of sacral region: Secondary | ICD-10-CM | POA: Diagnosis not present

## 2021-12-19 DIAGNOSIS — M9902 Segmental and somatic dysfunction of thoracic region: Secondary | ICD-10-CM | POA: Diagnosis not present

## 2022-01-18 DIAGNOSIS — U071 COVID-19: Secondary | ICD-10-CM | POA: Diagnosis not present

## 2022-01-31 DIAGNOSIS — M9903 Segmental and somatic dysfunction of lumbar region: Secondary | ICD-10-CM | POA: Diagnosis not present

## 2022-01-31 DIAGNOSIS — M9902 Segmental and somatic dysfunction of thoracic region: Secondary | ICD-10-CM | POA: Diagnosis not present

## 2022-01-31 DIAGNOSIS — M47816 Spondylosis without myelopathy or radiculopathy, lumbar region: Secondary | ICD-10-CM | POA: Diagnosis not present

## 2022-01-31 DIAGNOSIS — M47817 Spondylosis without myelopathy or radiculopathy, lumbosacral region: Secondary | ICD-10-CM | POA: Diagnosis not present

## 2022-01-31 DIAGNOSIS — M9904 Segmental and somatic dysfunction of sacral region: Secondary | ICD-10-CM | POA: Diagnosis not present

## 2022-01-31 DIAGNOSIS — S29012A Strain of muscle and tendon of back wall of thorax, initial encounter: Secondary | ICD-10-CM | POA: Diagnosis not present

## 2022-02-26 ENCOUNTER — Other Ambulatory Visit: Payer: Self-pay | Admitting: Family Medicine

## 2022-02-26 DIAGNOSIS — Z1231 Encounter for screening mammogram for malignant neoplasm of breast: Secondary | ICD-10-CM

## 2022-03-12 DIAGNOSIS — M9903 Segmental and somatic dysfunction of lumbar region: Secondary | ICD-10-CM | POA: Diagnosis not present

## 2022-03-12 DIAGNOSIS — M9904 Segmental and somatic dysfunction of sacral region: Secondary | ICD-10-CM | POA: Diagnosis not present

## 2022-03-12 DIAGNOSIS — M9902 Segmental and somatic dysfunction of thoracic region: Secondary | ICD-10-CM | POA: Diagnosis not present

## 2022-03-12 DIAGNOSIS — S29012A Strain of muscle and tendon of back wall of thorax, initial encounter: Secondary | ICD-10-CM | POA: Diagnosis not present

## 2022-03-12 DIAGNOSIS — M47816 Spondylosis without myelopathy or radiculopathy, lumbar region: Secondary | ICD-10-CM | POA: Diagnosis not present

## 2022-03-12 DIAGNOSIS — M47817 Spondylosis without myelopathy or radiculopathy, lumbosacral region: Secondary | ICD-10-CM | POA: Diagnosis not present

## 2022-03-23 ENCOUNTER — Ambulatory Visit
Admission: RE | Admit: 2022-03-23 | Discharge: 2022-03-23 | Disposition: A | Discharge status: Home / Self Care | Payer: Medicare PPO | Source: Ambulatory Visit | Attending: Family Medicine | Admitting: Family Medicine

## 2022-03-23 DIAGNOSIS — Z1231 Encounter for screening mammogram for malignant neoplasm of breast: Secondary | ICD-10-CM | POA: Diagnosis not present

## 2022-04-30 DIAGNOSIS — M47816 Spondylosis without myelopathy or radiculopathy, lumbar region: Secondary | ICD-10-CM | POA: Diagnosis not present

## 2022-04-30 DIAGNOSIS — M9903 Segmental and somatic dysfunction of lumbar region: Secondary | ICD-10-CM | POA: Diagnosis not present

## 2022-04-30 DIAGNOSIS — M9902 Segmental and somatic dysfunction of thoracic region: Secondary | ICD-10-CM | POA: Diagnosis not present

## 2022-04-30 DIAGNOSIS — M9904 Segmental and somatic dysfunction of sacral region: Secondary | ICD-10-CM | POA: Diagnosis not present

## 2022-04-30 DIAGNOSIS — S29012A Strain of muscle and tendon of back wall of thorax, initial encounter: Secondary | ICD-10-CM | POA: Diagnosis not present

## 2022-04-30 DIAGNOSIS — M47817 Spondylosis without myelopathy or radiculopathy, lumbosacral region: Secondary | ICD-10-CM | POA: Diagnosis not present

## 2022-05-01 DIAGNOSIS — F419 Anxiety disorder, unspecified: Secondary | ICD-10-CM | POA: Diagnosis not present

## 2022-05-01 DIAGNOSIS — J45909 Unspecified asthma, uncomplicated: Secondary | ICD-10-CM | POA: Diagnosis not present

## 2022-05-01 DIAGNOSIS — K219 Gastro-esophageal reflux disease without esophagitis: Secondary | ICD-10-CM | POA: Diagnosis not present

## 2022-05-01 DIAGNOSIS — E782 Mixed hyperlipidemia: Secondary | ICD-10-CM | POA: Diagnosis not present

## 2022-05-01 DIAGNOSIS — Z23 Encounter for immunization: Secondary | ICD-10-CM | POA: Diagnosis not present

## 2022-05-01 DIAGNOSIS — Z1331 Encounter for screening for depression: Secondary | ICD-10-CM | POA: Diagnosis not present

## 2022-05-01 DIAGNOSIS — I10 Essential (primary) hypertension: Secondary | ICD-10-CM | POA: Diagnosis not present

## 2022-05-01 DIAGNOSIS — Z0000 Encounter for general adult medical examination without abnormal findings: Secondary | ICD-10-CM | POA: Diagnosis not present

## 2022-05-01 DIAGNOSIS — R7303 Prediabetes: Secondary | ICD-10-CM | POA: Diagnosis not present

## 2022-06-08 DIAGNOSIS — M9904 Segmental and somatic dysfunction of sacral region: Secondary | ICD-10-CM | POA: Diagnosis not present

## 2022-06-08 DIAGNOSIS — M9902 Segmental and somatic dysfunction of thoracic region: Secondary | ICD-10-CM | POA: Diagnosis not present

## 2022-06-08 DIAGNOSIS — M9903 Segmental and somatic dysfunction of lumbar region: Secondary | ICD-10-CM | POA: Diagnosis not present

## 2022-06-08 DIAGNOSIS — M47816 Spondylosis without myelopathy or radiculopathy, lumbar region: Secondary | ICD-10-CM | POA: Diagnosis not present

## 2022-06-08 DIAGNOSIS — S29012A Strain of muscle and tendon of back wall of thorax, initial encounter: Secondary | ICD-10-CM | POA: Diagnosis not present

## 2022-06-08 DIAGNOSIS — M47817 Spondylosis without myelopathy or radiculopathy, lumbosacral region: Secondary | ICD-10-CM | POA: Diagnosis not present

## 2022-08-03 DIAGNOSIS — H25013 Cortical age-related cataract, bilateral: Secondary | ICD-10-CM | POA: Diagnosis not present

## 2022-08-03 DIAGNOSIS — H2511 Age-related nuclear cataract, right eye: Secondary | ICD-10-CM | POA: Diagnosis not present

## 2022-08-03 DIAGNOSIS — H25043 Posterior subcapsular polar age-related cataract, bilateral: Secondary | ICD-10-CM | POA: Diagnosis not present

## 2022-08-03 DIAGNOSIS — H18413 Arcus senilis, bilateral: Secondary | ICD-10-CM | POA: Diagnosis not present

## 2022-08-03 DIAGNOSIS — H2513 Age-related nuclear cataract, bilateral: Secondary | ICD-10-CM | POA: Diagnosis not present

## 2022-09-29 DIAGNOSIS — R0981 Nasal congestion: Secondary | ICD-10-CM | POA: Diagnosis not present

## 2022-09-29 DIAGNOSIS — J329 Chronic sinusitis, unspecified: Secondary | ICD-10-CM | POA: Diagnosis not present

## 2022-09-29 DIAGNOSIS — J301 Allergic rhinitis due to pollen: Secondary | ICD-10-CM | POA: Diagnosis not present

## 2022-10-12 DIAGNOSIS — H2511 Age-related nuclear cataract, right eye: Secondary | ICD-10-CM | POA: Diagnosis not present

## 2022-10-12 DIAGNOSIS — H25811 Combined forms of age-related cataract, right eye: Secondary | ICD-10-CM | POA: Diagnosis not present

## 2022-11-04 DIAGNOSIS — U071 COVID-19: Secondary | ICD-10-CM | POA: Diagnosis not present

## 2022-11-05 DIAGNOSIS — G4700 Insomnia, unspecified: Secondary | ICD-10-CM | POA: Diagnosis not present

## 2022-11-05 DIAGNOSIS — J309 Allergic rhinitis, unspecified: Secondary | ICD-10-CM | POA: Diagnosis not present

## 2022-11-05 DIAGNOSIS — U071 COVID-19: Secondary | ICD-10-CM | POA: Diagnosis not present

## 2022-11-05 DIAGNOSIS — J45909 Unspecified asthma, uncomplicated: Secondary | ICD-10-CM | POA: Diagnosis not present

## 2022-11-05 DIAGNOSIS — K219 Gastro-esophageal reflux disease without esophagitis: Secondary | ICD-10-CM | POA: Diagnosis not present

## 2022-11-05 DIAGNOSIS — I10 Essential (primary) hypertension: Secondary | ICD-10-CM | POA: Diagnosis not present

## 2022-11-05 DIAGNOSIS — E782 Mixed hyperlipidemia: Secondary | ICD-10-CM | POA: Diagnosis not present

## 2022-11-05 DIAGNOSIS — R7303 Prediabetes: Secondary | ICD-10-CM | POA: Diagnosis not present

## 2022-11-05 DIAGNOSIS — N3281 Overactive bladder: Secondary | ICD-10-CM | POA: Diagnosis not present

## 2022-11-05 DIAGNOSIS — F419 Anxiety disorder, unspecified: Secondary | ICD-10-CM | POA: Diagnosis not present

## 2023-01-11 DIAGNOSIS — H2512 Age-related nuclear cataract, left eye: Secondary | ICD-10-CM | POA: Diagnosis not present

## 2023-01-11 DIAGNOSIS — H25012 Cortical age-related cataract, left eye: Secondary | ICD-10-CM | POA: Diagnosis not present

## 2023-01-11 DIAGNOSIS — H25812 Combined forms of age-related cataract, left eye: Secondary | ICD-10-CM | POA: Diagnosis not present

## 2023-01-11 DIAGNOSIS — H25042 Posterior subcapsular polar age-related cataract, left eye: Secondary | ICD-10-CM | POA: Diagnosis not present

## 2023-02-11 DIAGNOSIS — Z124 Encounter for screening for malignant neoplasm of cervix: Secondary | ICD-10-CM | POA: Diagnosis not present

## 2023-02-11 DIAGNOSIS — R635 Abnormal weight gain: Secondary | ICD-10-CM | POA: Diagnosis not present

## 2023-02-11 DIAGNOSIS — Z6831 Body mass index (BMI) 31.0-31.9, adult: Secondary | ICD-10-CM | POA: Diagnosis not present

## 2023-05-07 DIAGNOSIS — J0141 Acute recurrent pansinusitis: Secondary | ICD-10-CM | POA: Diagnosis not present

## 2023-05-07 DIAGNOSIS — R051 Acute cough: Secondary | ICD-10-CM | POA: Diagnosis not present

## 2023-06-07 DIAGNOSIS — J45909 Unspecified asthma, uncomplicated: Secondary | ICD-10-CM | POA: Diagnosis not present

## 2023-06-07 DIAGNOSIS — Z1331 Encounter for screening for depression: Secondary | ICD-10-CM | POA: Diagnosis not present

## 2023-06-07 DIAGNOSIS — E782 Mixed hyperlipidemia: Secondary | ICD-10-CM | POA: Diagnosis not present

## 2023-06-07 DIAGNOSIS — N3281 Overactive bladder: Secondary | ICD-10-CM | POA: Diagnosis not present

## 2023-06-07 DIAGNOSIS — K219 Gastro-esophageal reflux disease without esophagitis: Secondary | ICD-10-CM | POA: Diagnosis not present

## 2023-06-07 DIAGNOSIS — R7303 Prediabetes: Secondary | ICD-10-CM | POA: Diagnosis not present

## 2023-06-07 DIAGNOSIS — I10 Essential (primary) hypertension: Secondary | ICD-10-CM | POA: Diagnosis not present

## 2023-06-07 DIAGNOSIS — F419 Anxiety disorder, unspecified: Secondary | ICD-10-CM | POA: Diagnosis not present

## 2023-06-07 DIAGNOSIS — J309 Allergic rhinitis, unspecified: Secondary | ICD-10-CM | POA: Diagnosis not present

## 2023-06-07 DIAGNOSIS — Z0000 Encounter for general adult medical examination without abnormal findings: Secondary | ICD-10-CM | POA: Diagnosis not present

## 2023-07-15 ENCOUNTER — Encounter: Payer: Self-pay | Admitting: Allergy

## 2023-07-15 ENCOUNTER — Ambulatory Visit: Payer: Medicare PPO | Admitting: Allergy

## 2023-07-15 VITALS — BP 138/82 | HR 91 | Temp 97.9°F | Resp 16 | Ht 64.96 in | Wt 186.2 lb

## 2023-07-15 DIAGNOSIS — R053 Chronic cough: Secondary | ICD-10-CM | POA: Diagnosis not present

## 2023-07-15 DIAGNOSIS — J4520 Mild intermittent asthma, uncomplicated: Secondary | ICD-10-CM

## 2023-07-15 DIAGNOSIS — K219 Gastro-esophageal reflux disease without esophagitis: Secondary | ICD-10-CM

## 2023-07-15 DIAGNOSIS — J310 Chronic rhinitis: Secondary | ICD-10-CM

## 2023-07-15 MED ORDER — AZELASTINE-FLUTICASONE 137-50 MCG/ACT NA SUSP: 1.0000 | Freq: Two times a day (BID) | NASAL | 5 refills | Status: AC

## 2023-07-15 MED ORDER — LEVALBUTEROL TARTRATE 45 MCG/ACT IN AERO: 4.0000 | INHALATION_SPRAY | Freq: Once | RESPIRATORY_TRACT | Status: AC

## 2023-07-15 NOTE — Progress Notes (Signed)
New Patient Note  RE: Rachel Edwards MRN: [id number] DOB: [birth year] Date of Office Visit: 07/15/2023  Consult requested by: Tally Joe, MD Primary care provider: Tally Joe, MD  Chief Complaint: Nasal Congestion, Cough, and Hoarse  History of Present Illness: I had the pleasure of seeing Rachel Edwards for initial evaluation at the [facility] on 07/15/2023. She is a 73 y.o. female, who is referred here by Tally Joe, MD for the evaluation of allergic rhinitis.  Discussed the use of AI scribe software for clinical note transcription with the patient, who gave verbal consent to proceed.    Rachel Edwards is a 73 year old female with asthma who presents with chronic cough and sinus symptoms.   She has been experiencing a persistent cough and significant congestion, along with postnasal drip, rhinorrhea, sneezing, and nasal pruritus since mid-January 2025. Her voice has been affected since last Tuesday. This episode is the worst of three occurrences over the past year, with previous episodes in March 2024 and October 2024, each lasting several weeks. During these episodes, she experienced a dry cough with minimal congestion. No significant wheezing, though she occasionally uses an albuterol inhaler when sensing wheezing, approximately a couple of times a month. She has had two sinus infections in the past year and was treated with antibiotics in May and December 2024.  She has a history of asthma  and allergic rhinitis diagnosed in the 1980s, for which she underwent allergy testing and received allergy shots for two to three years, which were effective until recently. She has not seen an ear, nose, and throat specialist and has not had any nasal surgeries or polyps.   She uses Flonase and takes cetirizine and chlorphenamine for her symptoms, noting some improvement when increasing the dose of chlorphenamine.  Her past medical history includes a hiatal hernia  repair two years ago, for which she continues to take pantoprazole daily. No significant reflux symptoms during her coughing episodes. She has been on lisinopril for blood pressure for about ten years, with no recent changes in dosage.   She has never smoked and has five dogs at home, which she has had for seven years without noticing any issues related to them. No fever, chills, changes in appetite, bowel habits, or rashes. No recent chest X-rays, food or medication allergies, bee sting allergies, hives, eczema, or frequent illnesses, except for having COVID-19 last year.     She reports symptoms of cough, nasal congestion, rhinorrhea, PND, sneezing, itchy nose. Symptoms have been going on for 1 month but had issues with allergic symptoms as above x 1 yr. The symptoms are present mainly in the spring and fall. Other triggers include exposure to unknown. Anosmia: no. Headache: sometimes. She has used Flonase, zyrtec, chlorpheniramine with minimal improvement in symptoms. Sinus infections: 2 last year. Previous work up includes: skin testing in 1980s and was on AIT for 2-3 yrs with good benefit. Previous ENT evaluation: no. Previous sinus imaging: no. History of nasal polyps: no. Last eye exam: December 2024. History of reflux: yes and takes pantoprazole daily. S/p hiatal hernia repair.   Assessment and Plan: Rachel Edwards is a 73 y.o. female with: Chronic coughing Asthma  Chronic rhinitis Persistent cough and sinus symptoms including congestion, postnasal drip, sneezing, and itchy nose. Symptoms have been present since last year with periods of remission. Previous history of allergies and allergy shots in the 1980s. Currently on Flonase, albuterol, cetirizine, and chlorphenamine. Today's spirometry showed normal  pattern with 4% improvement in FEV1 post bronchodilator treatment. Clinically feeling unchanged.  The most common causes of chronic cough include the following: upper airway cough syndrome  (UACS) which is caused by variety of rhinitis conditions; asthma; gastroesophageal reflux disease (GERD); chronic bronchitis from cigarette smoking or other inhaled environmental irritants; non-asthmatic eosinophilic bronchitis; and bronchiectasis.  In prospective studies, these conditions have accounted for up to 94% of the causes of chronic cough in immunocompetent adults.  In your case, concerning if there's potential PND or asthma-like issues that may be contributing to your cough. May use albuterol rescue inhaler 2 puffs every 4 to 6 hours as needed for shortness of breath, chest tightness, coughing, and wheezing.  Monitor frequency of use - if you need to use it more than twice per week on a consistent basis let us know.  Return for allergy skin testing. If negative will refer to ENT next and get CXR. Consider trial of stopping lisinopril as that can sometimes cause coughing.  Start dymista (fluticasone + azelastine nasal spray combination) 1 spray per nostril twice a day. This replaces your other nasal sprays. If it's not covered let us know.  Hold this 3 days before your allergy testing along with zyrtec and chlorpheniramine.  Nasal saline spray (i.e., Simply Saline) or nasal saline lavage (i.e., NeilMed) is recommended as needed and prior to medicated nasal sprays.  Gastroesophageal reflux disease, unspecified whether esophagitis present Patient on pantoprazole for GERD, related to a history of hiatal hernia. No recent exacerbation of symptoms. See handout for lifestyle and dietary modifications. Continue pantoprazole 40mg  once day - nothing to eat or drink for 20-30 minutes afterwards.   Return for Skin testing.  Meds ordered this encounter  Medications   levalbuterol (XOPENEX HFA) inhaler 4 puff   Azelastine-Fluticasone 137-50 MCG/ACT SUSP    Sig: Place 1 spray into the nose in the morning and at bedtime.    Dispense:  23 g    Refill:  5   Lab Orders  No laboratory test(s)  ordered today    Other allergy screening: Asthma:  Dry coughing and slight wheezing at times. Uses albuterol a few times per month with good benefit. Patient was on antibiotics in May and December. No prednisone. Currently on lisinopril for 10 yrs with no change in dose.  No recent CXR.   Food allergy: no Medication allergy: no Hymenoptera allergy: no Urticaria: no Eczema:no History of recurrent infections suggestive of immunodeficency: no  Diagnostics: Spirometry:  Tracings reviewed. Her effort: Good reproducible efforts. FVC: 2.24L FEV1: 1.72L, 80% predicted FEV1/FVC ratio: 77% Interpretation: Spirometry consistent with normal pattern with 4% improvement in FEV1 post bronchodilator treatment. Clinically feeling unchanged.   Please see scanned spirometry results for details.  Results discussed with patient/family.  Past Medical History: Patient Active Problem List   Diagnosis Date Noted   Status post Nissen fundoplication July 2018 12/10/2016   Diastolic dysfunction without heart failure 08/08/2016   Dilatation of aorta (HCC) 08/08/2016   Chest pain 08/06/2016   Symptomatic anemia 08/06/2016   Fecal occult blood test positive 08/06/2016   Situational depression 08/06/2016   Anemia    Status post total hysterectomy and bilateral salpingo-oophorectomy 02/24/2015   High risk medication use 02/24/2015   DCIS (ductal carcinoma in situ) of breast 02/24/2015   Estrogen receptor positive status (ER+) 07/31/2014   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   Obesity, Class I, BMI 30.0-34.9 (see actual BMI) 12/04/2013   Breast cancer of  lower-outer quadrant of right female breast (HCC) 10/09/2013   Past Medical History:  Diagnosis Date   Anemia 08/06/2016   Asthma    Breast cancer (HCC)    Breast cancer of lower-outer quadrant of right female breast (HCC) 10/09/2013   Chest pain 08/06/2016   DCIS (ductal carcinoma in situ) of breast 02/24/2015    Depression    Diastolic dysfunction without heart failure 08/08/2016   Dilatation of aorta (HCC) 08/08/2016   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   Estrogen receptor positive status (ER+) 07/31/2014   Fecal occult blood test positive 08/06/2016   Hiatal hernia    High risk medication use 02/24/2015   Hyperlipidemia    Hypertension    Obesity, Class I, BMI 30.0-34.9 (see actual BMI) 12/04/2013   Panic attack    Situational depression 08/06/2016   Status post total hysterectomy and bilateral salpingo-oophorectomy 02/24/2015   Symptomatic anemia 08/06/2016   Uterine fibroid    Wears glasses    Past Surgical History: Past Surgical History:  Procedure Laterality Date   ABDOMINAL HYSTERECTOMY  1998   BREAST BIOPSY     BREAST LUMPECTOMY     right june 2015   BREAST LUMPECTOMY WITH RADIOACTIVE SEED LOCALIZATION Right 11/04/2013   Procedure: RIGHT BREAST LUMPECTOMY WITH RADIOACTIVE SEED LOCALIZATION;  Surgeon: Robyne Askew, MD;  Location: [facility];  Service: General;  Laterality: Right;   DILATION AND CURETTAGE OF UTERUS     x2   ESOPHAGOGASTRODUODENOSCOPY (EGD) WITH PROPOFOL N/A 08/08/2016   Procedure: ESOPHAGOGASTRODUODENOSCOPY (EGD) WITH PROPOFOL;  Surgeon: Kathi Der, MD;  Location: [facility];  Service: Gastroenterology;  Laterality: N/A;   HERNIA REPAIR     umbil -age 46   LAPAROSCOPIC NISSEN FUNDOPLICATION N/A 12/10/2016   Procedure: LAPAROSCOPIC NISSEN FUNDOPLICATION;  Surgeon: Luretha Murphy, MD;  Location: [facility];  Service: General;  Laterality: N/A;   TONSILLECTOMY     WISDOM TOOTH EXTRACTION     Medication List:  Current Outpatient Medications  Medication Sig Dispense Refill   albuterol (PROVENTIL HFA;VENTOLIN HFA) 108 (90 Base) MCG/ACT inhaler Inhale 2 puffs into the lungs every 6 (six) hours as needed for wheezing or shortness of breath.     aspirin 81 MG tablet Take 81 mg by mouth daily.     Azelastine-Fluticasone 137-50  MCG/ACT SUSP Place 1 spray into the nose in the morning and at bedtime. 23 g 5   buPROPion (WELLBUTRIN XL) 150 MG 24 hr tablet Take 300 mg by mouth daily.      busPIRone (BUSPAR) 15 MG tablet Take 15 mg by mouth 2 (two) times daily.      calcium carbonate (OS-CAL) 600 MG TABS tablet Take 600 mg by mouth daily with breakfast.      Calcium Polycarbophil (FIBER-CAPS PO) Take 1 capsule by mouth daily.      cetirizine (ZYRTEC) 10 MG tablet Take 10 mg by mouth at bedtime.      Chlorpheniramine Maleate (CHLOR-TRIMETON PO) Take 1 tablet by mouth daily.     CRESTOR 10 MG tablet Take 10 mg by mouth at bedtime.      lisinopril (PRINIVIL,ZESTRIL) 5 MG tablet Take 5 mg by mouth daily.      Multiple Vitamin (MULTIVITAMIN WITH MINERALS) TABS tablet Take 1 tablet by mouth daily.     MYRBETRIQ 50 MG TB24 tablet Take 50 mg by mouth daily.  12   pantoprazole (PROTONIX) 40 MG tablet Take 40 mg by mouth as directed.  Probiotic Product (PROBIOTIC PO) Take 1 tablet by mouth daily.     Current Facility-Administered Medications  Medication Dose Route Frequency Provider Last Rate Last Admin   levalbuterol (XOPENEX HFA) inhaler 4 puff  4 puff Inhalation Once        Allergies: No Known Allergies Social History: Social History   Socioeconomic History   Marital status: Married    Spouse name: Not on file   Number of children: Not on file   Years of education: Not on file   Highest education level: Not on file  Occupational History   Not on file  Tobacco Use   Smoking status: Never    Passive exposure: Past   Smokeless tobacco: Never  Vaping Use   Vaping status: Never Used  Substance and Sexual Activity   Alcohol use: Yes    Alcohol/week: 2.0 - 3.0 standard drinks of alcohol    Types: 2 - 3 Standard drinks or equivalent per week    Comment: OCCASIONAL   Drug use: No   Sexual activity: Not on file  Other Topics Concern   Not on file  Social History Narrative   Not on file   Social Drivers of  Health   Financial Resource Strain: Not on file  Food Insecurity: Not on file  Transportation Needs: Not on file  Physical Activity: Not on file  Stress: Not on file  Social Connections: Not on file   Lives in a house. Smoking: denies Occupation: retired  Landscape architect HistorySurveyor, minerals in the house: no Engineer, civil (consulting) in the family room: no Carpet in the bedroom: yes Heating: gas Cooling: central Pet: yes 5 dogs x 7 yrs  Family History: Family History  Problem Relation Age of Onset   Allergic rhinitis Mother    Pneumonia Mother    Heart attack Father    Cancer Maternal Grandfather    Cancer Paternal Grandmother    Breast cancer Neg Hx    Angioedema Neg Hx    Asthma Neg Hx    Eczema Neg Hx    Urticaria Neg Hx    Review of Systems  Constitutional:  Negative for appetite change, chills, fever and unexpected weight change.  HENT:  Positive for congestion. Negative for rhinorrhea.   Eyes:  Negative for itching.  Respiratory:  Positive for cough and wheezing. Negative for chest tightness and shortness of breath.   Cardiovascular:  Negative for chest pain.  Gastrointestinal:  Negative for abdominal pain.  Genitourinary:  Negative for difficulty urinating.  Skin:  Negative for rash.  Neurological:  Positive for headaches.    Objective: BP 138/82 (BP Location: Left Arm, Patient Position: Sitting, Cuff Size: Normal)   Pulse 91   Temp 97.9 F (36.6 C) (Temporal)   Resp 16   Ht 5' 4.96" (1.65 m)   Wt 186 lb 3.2 oz (84.5 kg)   SpO2 95%   BMI 31.02 kg/m  Body mass index is 31.02 kg/m. Physical Exam Vitals and nursing note reviewed.  Constitutional:      Appearance: Normal appearance. She is well-developed.  HENT:     Head: Normocephalic and atraumatic.     Right Ear: Tympanic membrane and external ear normal.     Left Ear: Tympanic membrane and external ear normal.     Nose: Nose normal.     Mouth/Throat:     Mouth: Mucous membranes are moist.     Pharynx:  Oropharynx is clear.  Eyes:     Conjunctiva/sclera: Conjunctivae normal.  Cardiovascular:  Rate and Rhythm: Normal rate and regular rhythm.     Heart sounds: Normal heart sounds. No murmur heard.    No friction rub. No gallop.  Pulmonary:     Effort: Pulmonary effort is normal.     Breath sounds: Normal breath sounds. No wheezing, rhonchi or rales.  Musculoskeletal:     Cervical back: Neck supple.  Skin:    General: Skin is warm.     Findings: No rash.  Neurological:     Mental Status: She is alert and oriented to person, place, and time.  Psychiatric:        Behavior: Behavior normal.   The plan was reviewed with the patient/family, and all questions/concerned were addressed.  It was my pleasure to see Rachel Edwards today and participate in her care. Please feel free to contact me with any questions or concerns.  Sincerely,  Wyline Mood, DO Allergy & Immunology  [facility] office: [phone] [facility] office: [phone]

## 2023-07-15 NOTE — Patient Instructions (Addendum)
Coughing The most common causes of chronic cough include the following: upper airway cough syndrome (UACS) which is caused by variety of rhinitis conditions; asthma; gastroesophageal reflux disease (GERD); chronic bronchitis from cigarette smoking or other inhaled environmental irritants; non-asthmatic eosinophilic bronchitis; and bronchiectasis.  In prospective studies, these conditions have accounted for up to 94% of the causes of chronic cough in immunocompetent adults.   In your case, concerning if there's potential PND or asthma-like issues that may be contributing to your cough.  Coughing  May use albuterol rescue inhaler 2 puffs every 4 to 6 hours as needed for shortness of breath, chest tightness, coughing, and wheezing.  Monitor frequency of use - if you need to use it more than twice per week on a consistent basis let us know.  Breathing control goals:  Full participation in all desired activities (may need albuterol before activity) Albuterol use two times or less a week on average (not counting use with activity) Cough interfering with sleep two times or less a month Oral steroids no more than once a year No hospitalizations   Reflux See handout for lifestyle and dietary modifications. Continue pantoprazole 40mg  once day - nothing to eat or drink for 20-30 minutes afterwards.   Rhinitis  Return for allergy skin testing. If negative will refer to ENT next.  Will make additional recommendations based on results. Make sure you don't take any antihistamines for 3 days before the skin testing appointment. Don't put any lotion on the back and arms on the day of testing.  Plan on being here for 30-60 minutes.  Start dymista (fluticasone + azelastine nasal spray combination) 1 spray per nostril twice a day. This replaces your other nasal sprays. If it's not covered let us know.  Hold this 3 days before your allergy testing along with zyrtec and chlorpheniramine.  Nasal saline spray  (i.e., Simply Saline) or nasal saline lavage (i.e., NeilMed) is recommended as needed and prior to medicated nasal sprays.

## 2023-07-21 NOTE — Progress Notes (Unsigned)
 Skin testing note  RE: Rachel Edwards MRN: [id number] DOB: [birth year] Date of Office Visit: 07/22/2023  Referring provider: Tally Joe, MD Primary care provider: Tally Joe, MD  Chief Complaint: skin testing  History of Present Illness: I had the pleasure of seeing Rachel Edwards for a skin testing visit at the [facility] on 07/22/2023. She is a 73 y.o. female, who is being followed for chronic rhinitis, coughing, asthma, GERD. Her previous allergy office visit was on 07/15/2023 with Dr. Selena Batten. Today is a skin testing visit.   Discussed the use of AI scribe software for clinical note transcription with the patient, who gave verbal consent to proceed.    She has been experiencing a persistent cough that initially improved after starting Dymista nasal spray, but worsened after discontinuing all medications prior to this visit.   She has not needed to use her rescue inhaler since last week and continues to take medication for reflux. Stopping her antihistamines in the last few days has resulted in more coughing. Despite a negative allergy test, she feels that antihistamines and the nasal spray help her symptoms.  She has not had a recent chest x-ray despite the persistent cough. She has had COVID-19 twice last year.     Assessment and Plan: Rachel Edwards is a 73 y.o. female with: Chronic coughing Asthma  Chronic rhinitis Past history - persistent cough and sinus symptoms including congestion, postnasal drip, sneezing, and itchy nose. Symptoms have been present since last year with periods of remission. Previous history of allergies and allergy shots in the 1980s. Currently on Flonase, albuterol, cetirizine, and chlorphenamine. 2025 spirometry showed normal pattern with 4% improvement in FEV1 post bronchodilator treatment. Clinically feeling unchanged.  Interim history - some benefit with dymista.  Today's skin testing negative to indoor/outdoor allergens. Start  dymista (fluticasone + azelastine nasal spray combination) 1 spray per nostril twice a day. This replaces your other nasal sprays. If it's not covered let us know.  Nasal saline spray (i.e., Simply Saline) or nasal saline lavage (i.e., NeilMed) is recommended as needed and prior to medicated nasal sprays. If no improvement after a few weeks then add on antihistamines: Use over the counter antihistamines such as Zyrtec (cetirizine), Claritin (loratadine), Allegra (fexofenadine), or Xyzal (levocetirizine) daily as needed. May switch antihistamines every few months. May use albuterol rescue inhaler 2 puffs every 4 to 6 hours as needed for shortness of breath, chest tightness, coughing, and wheezing.  Monitor frequency of use - if you need to use it more than twice per week on a consistent basis let us know.  If no improvement with the above regimen then will refer to ENT, get CXR and consider switching lisinopril next.  Gastroesophageal reflux disease, unspecified whether esophagitis present Past history - pantoprazole for GERD, related to a history of hiatal hernia. No recent exacerbation of symptoms. Continue lifestyle and dietary modifications. Continue pantoprazole 40mg  once day - nothing to eat or drink for 20-30 minutes afterwards.   Return in about 2 months (around 09/19/2023).  No orders of the defined types were placed in this encounter.  Lab Orders  No laboratory test(s) ordered today    Diagnostics: Skin Testing: Environmental allergy panel. Today's skin testing negative to indoor/outdoor allergens. Results discussed with patient/family.  Airborne Adult Perc - 07/22/23 1516     Time Antigen Placed 0310    Allergen Manufacturer Waynette Buttery    Location Back    Number of Test 55    Panel  1 Select    1. Control-Buffer 50% Glycerol Negative    2. Control-Histamine 2+    3. Bahia Negative    4. French Southern Territories Negative    5. Johnson Negative    6. Kentucky Blue Negative    7. Meadow Fescue  Negative    8. Perennial Rye Negative    9. Timothy Negative    10. Ragweed Mix Negative    11. Cocklebur Negative    12. Plantain,  English Negative    13. Baccharis Negative    14. Dog Fennel Negative    15. Russian Thistle Negative    16. Lamb's Quarters Negative    17. Sheep Sorrell Negative    18. Rough Pigweed Negative    19. Marsh Elder, Rough Negative    20. Mugwort, Common Negative    21. Box, Elder Negative    22. Cedar, red Negative    23. Sweet Gum Negative    24. Pecan Pollen Negative    25. Pine Mix Negative    26. Walnut, Black Pollen Negative    27. Red Mulberry Negative    28. Ash Mix Negative    29. Birch Mix Negative    30. Beech American Negative    31. Cottonwood, Guinea-Bissau Negative    32. Hickory, White Negative    33. Maple Mix Negative    34. Oak, Guinea-Bissau Mix Negative    35. Sycamore Eastern Negative    36. Alternaria Alternata Negative    37. Cladosporium Herbarum Negative    38. Aspergillus Mix Negative    39. Penicillium Mix Negative    40. Bipolaris Sorokiniana (Helminthosporium) Negative    41. Drechslera Spicifera (Curvularia) Negative    42. Mucor Plumbeus Negative    43. Fusarium Moniliforme Negative    44. Aureobasidium Pullulans (pullulara) Negative    45. Rhizopus Oryzae Negative    46. Botrytis Cinera Negative    47. Epicoccum Nigrum Negative    48. Phoma Betae Negative    49. Dust Mite Mix Negative    50. Cat Hair 10,000 BAU/ml Negative    51.  Dog Epithelia Negative    52. Mixed Feathers Negative    53. Horse Epithelia Negative    54. Cockroach, German Negative    55. Tobacco Leaf Negative             Intradermal - 07/22/23 1538     Time Antigen Placed 0335    Allergen Manufacturer Waynette Buttery    Location Arm    Number of Test 16    Intradermal Select    Control Negative    Bahia Negative    French Southern Territories Negative    Johnson Negative    7 Grass Negative    Ragweed Mix Negative    Weed Mix Negative    Tree Mix Negative     Mold 1 Negative    Mold 2 Negative    Mold 3 Negative    Mold 4 Negative    Mite Mix Negative    Cat Negative    Dog Negative    Cockroach Negative             Previous notes and tests were reviewed. The plan was reviewed with the patient/family, and all questions/concerned were addressed.  It was my pleasure to see Rachel Edwards today and participate in her care. Please feel free to contact me with any questions or concerns.  Sincerely,  Wyline Mood, DO Allergy & Immunology  [facility] office: [phone] [place] office: [phone]

## 2023-07-22 ENCOUNTER — Ambulatory Visit: Payer: Medicare PPO | Admitting: Allergy

## 2023-07-22 ENCOUNTER — Encounter: Payer: Self-pay | Admitting: Allergy

## 2023-07-22 DIAGNOSIS — J310 Chronic rhinitis: Secondary | ICD-10-CM

## 2023-07-22 DIAGNOSIS — K219 Gastro-esophageal reflux disease without esophagitis: Secondary | ICD-10-CM

## 2023-07-22 DIAGNOSIS — R053 Chronic cough: Secondary | ICD-10-CM

## 2023-07-22 DIAGNOSIS — J4520 Mild intermittent asthma, uncomplicated: Secondary | ICD-10-CM

## 2023-07-22 NOTE — Patient Instructions (Addendum)
 Today's skin testing negative to indoor/outdoor allergens.  Results given.  Coughing  Start dymista (fluticasone + azelastine nasal spray combination) 1 spray per nostril twice a day. This replaces your other nasal sprays. If it's not covered let us know.  Nasal saline spray (i.e., Simply Saline) or nasal saline lavage (i.e., NeilMed) is recommended as needed and prior to medicated nasal sprays.  If no improvement after a few weeks then add on antihistamines: Use over the counter antihistamines such as Zyrtec (cetirizine), Claritin (loratadine), Allegra (fexofenadine), or Xyzal (levocetirizine) daily as needed. May switch antihistamines every few months.  May use albuterol rescue inhaler 2 puffs every 4 to 6 hours as needed for shortness of breath, chest tightness, coughing, and wheezing.  Monitor frequency of use - if you need to use it more than twice per week on a consistent basis let us know.  Breathing control goals:  Full participation in all desired activities (may need albuterol before activity) Albuterol use two times or less a week on average (not counting use with activity) Cough interfering with sleep two times or less a month Oral steroids no more than once a year No hospitalizations   Reflux Continue lifestyle and dietary modifications. Continue pantoprazole 40mg  once day - nothing to eat or drink for 20-30 minutes afterwards.   Follow up in 2 months or sooner if needed.  If no improvement will do the following: Refer to ENT, get CXR and consider switching lisinopril.

## 2023-08-15 DIAGNOSIS — I10 Essential (primary) hypertension: Secondary | ICD-10-CM | POA: Diagnosis not present

## 2023-08-15 DIAGNOSIS — R053 Chronic cough: Secondary | ICD-10-CM | POA: Diagnosis not present

## 2023-08-15 DIAGNOSIS — J45909 Unspecified asthma, uncomplicated: Secondary | ICD-10-CM | POA: Diagnosis not present

## 2023-08-15 DIAGNOSIS — K219 Gastro-esophageal reflux disease without esophagitis: Secondary | ICD-10-CM | POA: Diagnosis not present

## 2023-09-18 ENCOUNTER — Ambulatory Visit: Payer: Medicare PPO | Admitting: Allergy

## 2023-12-05 DIAGNOSIS — J309 Allergic rhinitis, unspecified: Secondary | ICD-10-CM | POA: Diagnosis not present

## 2023-12-05 DIAGNOSIS — G4700 Insomnia, unspecified: Secondary | ICD-10-CM | POA: Diagnosis not present

## 2023-12-05 DIAGNOSIS — R7303 Prediabetes: Secondary | ICD-10-CM | POA: Diagnosis not present

## 2023-12-05 DIAGNOSIS — E782 Mixed hyperlipidemia: Secondary | ICD-10-CM | POA: Diagnosis not present

## 2023-12-05 DIAGNOSIS — N3281 Overactive bladder: Secondary | ICD-10-CM | POA: Diagnosis not present

## 2023-12-05 DIAGNOSIS — K219 Gastro-esophageal reflux disease without esophagitis: Secondary | ICD-10-CM | POA: Diagnosis not present

## 2023-12-05 DIAGNOSIS — J45909 Unspecified asthma, uncomplicated: Secondary | ICD-10-CM | POA: Diagnosis not present

## 2023-12-05 DIAGNOSIS — F419 Anxiety disorder, unspecified: Secondary | ICD-10-CM | POA: Diagnosis not present

## 2023-12-05 DIAGNOSIS — I10 Essential (primary) hypertension: Secondary | ICD-10-CM | POA: Diagnosis not present

## 2024-01-14 DIAGNOSIS — R109 Unspecified abdominal pain: Secondary | ICD-10-CM | POA: Diagnosis not present

## 2024-01-16 DIAGNOSIS — F419 Anxiety disorder, unspecified: Secondary | ICD-10-CM | POA: Diagnosis not present

## 2024-01-16 DIAGNOSIS — I10 Essential (primary) hypertension: Secondary | ICD-10-CM | POA: Diagnosis not present

## 2024-01-16 DIAGNOSIS — R1013 Epigastric pain: Secondary | ICD-10-CM | POA: Diagnosis not present

## 2024-02-25 DIAGNOSIS — Z6831 Body mass index (BMI) 31.0-31.9, adult: Secondary | ICD-10-CM | POA: Diagnosis not present

## 2024-02-25 DIAGNOSIS — K219 Gastro-esophageal reflux disease without esophagitis: Secondary | ICD-10-CM | POA: Diagnosis not present

## 2024-02-25 DIAGNOSIS — F419 Anxiety disorder, unspecified: Secondary | ICD-10-CM | POA: Diagnosis not present

## 2024-02-25 DIAGNOSIS — I10 Essential (primary) hypertension: Secondary | ICD-10-CM | POA: Diagnosis not present
